# Patient Record
Sex: Male | Born: 1975 | Race: White | Hispanic: No | Smoking: Current every day smoker
Health system: Southern US, Community
[De-identification: ages and names within clinical notes are randomized; demographics above are authoritative.]

## PROBLEM LIST (undated history)

## (undated) ENCOUNTER — Emergency Department (HOSPITAL_COMMUNITY): Payer: Self-pay

## (undated) DIAGNOSIS — K4090 Unilateral inguinal hernia, without obstruction or gangrene, not specified as recurrent: Secondary | ICD-10-CM

## (undated) DIAGNOSIS — I10 Essential (primary) hypertension: Secondary | ICD-10-CM

## (undated) HISTORY — PX: WISDOM TOOTH EXTRACTION: SHX21

---

## 2006-01-24 ENCOUNTER — Emergency Department (HOSPITAL_COMMUNITY): Admission: EM | Admit: 2006-01-24 | Discharge: 2006-01-24 | Payer: Self-pay | Admitting: Emergency Medicine

## 2006-08-24 ENCOUNTER — Emergency Department (HOSPITAL_COMMUNITY): Admission: EM | Admit: 2006-08-24 | Discharge: 2006-08-25 | Payer: Self-pay | Admitting: Emergency Medicine

## 2011-12-18 ENCOUNTER — Encounter (HOSPITAL_COMMUNITY): Payer: Self-pay | Admitting: *Deleted

## 2011-12-18 ENCOUNTER — Emergency Department (HOSPITAL_COMMUNITY)
Admission: EM | Admit: 2011-12-18 | Discharge: 2011-12-18 | Disposition: A | Discharge status: Home / Self Care | Payer: Self-pay | Attending: Emergency Medicine | Admitting: Emergency Medicine

## 2011-12-18 DIAGNOSIS — L03039 Cellulitis of unspecified toe: Secondary | ICD-10-CM | POA: Insufficient documentation

## 2011-12-18 DIAGNOSIS — L03031 Cellulitis of right toe: Secondary | ICD-10-CM

## 2011-12-18 LAB — CBC
MCV: 90.3 fL (ref 78.0–100.0)
Platelets: 205 10*3/uL (ref 150–400)
RBC: 5.06 MIL/uL (ref 4.22–5.81)
WBC: 10.9 10*3/uL — ABNORMAL HIGH (ref 4.0–10.5)

## 2011-12-18 LAB — BASIC METABOLIC PANEL
CO2: 30 mEq/L (ref 19–32)
Chloride: 99 mEq/L (ref 96–112)
Sodium: 138 mEq/L (ref 135–145)

## 2011-12-18 MED ORDER — DIPHENHYDRAMINE HCL 25 MG PO CAPS
ORAL_CAPSULE | ORAL | Status: AC
  Administered 2011-12-18: 25 mg
  Filled 2011-12-18: qty 1

## 2011-12-18 MED ORDER — VANCOMYCIN HCL IN DEXTROSE 1-5 GM/200ML-% IV SOLN
1000.0000 mg | Freq: Once | INTRAVENOUS | Status: AC
  Administered 2011-12-18: 1000 mg via INTRAVENOUS
  Filled 2011-12-18: qty 200

## 2011-12-18 MED ORDER — DOXYCYCLINE HYCLATE 100 MG PO CAPS: 100.0000 mg | ORAL_CAPSULE | Freq: Two times a day (BID) | ORAL | Status: DC

## 2011-12-18 MED ORDER — FENTANYL CITRATE 0.05 MG/ML IJ SOLN: 100.0000 ug | Freq: Once | INTRAMUSCULAR | Status: DC

## 2011-12-18 MED ORDER — IBUPROFEN 200 MG PO TABS: 800.0000 mg | ORAL_TABLET | Freq: Four times a day (QID) | ORAL | Status: DC | PRN

## 2011-12-18 MED ORDER — DOXYCYCLINE HYCLATE 100 MG PO CAPS: 100.0000 mg | ORAL_CAPSULE | Freq: Two times a day (BID) | ORAL | Status: AC

## 2011-12-18 MED ORDER — DOXYCYCLINE HYCLATE 100 MG PO TABS
100.0000 mg | ORAL_TABLET | Freq: Once | ORAL | Status: AC
  Administered 2011-12-18: 100 mg via ORAL
  Filled 2011-12-18: qty 1

## 2011-12-18 NOTE — ED Provider Notes (Addendum)
History     CSN: [id number]  Arrival date & time 12/18/11  0401   First MD Initiated Contact with Patient 12/18/11 [phone]      Chief Complaint  Patient presents with  . Toe infection     (Consider location/radiation/quality/duration/timing/severity/associated sxs/prior treatment) HPI So 36 year old white male who [place]. He slipped on some heard.jammed under the nail of the right fifth toe. That toe subsequently become swollen, erythematous and painful. He states the pain has been severe at its worst. It is painful to walk. There's been purulent drainage from the medial side of the nail which is where the pain is the worst. There's also is red streaking up his right foot and lower leg.  History reviewed. No pertinent past medical history.  History reviewed. No pertinent past surgical history.  No family history on file.  History  Substance Use Topics  . Smoking status: Current Everyday Smoker  . Smokeless tobacco: Not on file  . Alcohol Use: Yes     occasionally      Review of Systems  All other systems reviewed and are negative.    Allergies  Morphine and related  Home Medications   Current Outpatient Rx  Name Route Sig Dispense Refill  . IBUPROFEN 200 MG PO TABS Oral Take 800 mg by mouth every 6 (six) hours as needed. For pain      BP 126/80  Pulse 87  Temp 97.8 F (36.6 C) (Oral)  Resp 18  Wt 138 lb (62.596 kg)  SpO2 99%  Physical Exam General: Well-developed, well-nourished male in no acute distress; appearance consistent with age of record HENT: normocephalic, atraumatic Eyes: pupils equal round and reactive to light; extraocular muscles intact Neck: supple Heart: regular rate and rhythm Lungs: clear to auscultation bilaterally Abdomen: soft, nondistended Extremities: No deformity; full range of motion; pulses normal; paronychia of the medial side of the right fifth toenail with swelling and erythema of the  right fifth toe and lymphangitis from the right fifth toe the foot right foot and lower leg; pad of right fifth toe soft without evidence of felon Lymphatic: No inguinal lymphadenopathy Neurologic: Awake, alert and oriented; motor function intact in all extremities and symmetric; no facial droop Skin: Warm and dry Psychiatric: Normal mood and affect    ED Course  Procedures (including critical care time)  INCISION AND DRAINAGE Performed by: Paula Libra L Consent: Verbal consent obtained. Risks and benefits: risks, benefits and alternatives were discussed Type: Paronychia   Body area: Medial aspect of right fifth toenail  Anesthesia: patient refused  Complexity: Simple  Drainage: purulent  Drainage amount: Several drops; swab obtained for C&S   Packing material: None   Patient tolerance: Patient tolerated the procedure well with no immediate complications.      MDM  7:18 AM Vancomycin 1 g IV ordered. We'll discharge home on doxycycline and have him return in 24 hours for recheck return sooner for worsening symptoms.           Hanley Seamen, MD 12/18/11 2841  Hanley Seamen, MD 12/18/11 [phone]

## 2011-12-18 NOTE — ED Notes (Signed)
Pt [place], walking over some stones and his foot slipped, pt states "he thinks he got a bunch of dirt under his toe nail" and pt is now experiencing redness and drainage from rt fifth toe.

## 2011-12-18 NOTE — ED Notes (Addendum)
Pt presents to ED with c/o pain and tenderness to right 5th toe, 5th toe is red and swollen.  Pt states that his right 5th toe hit a rock last Monday, states that area is progressively "getting worse". '. '

## 2011-12-19 NOTE — ED Notes (Signed)
Per Earley Favor NP, Rx changed to Clindamycin 150 mg - 1 PO TID x 10 days.  rx was changed due to it being too expensive. Called into [facility] [phone]

## 2011-12-20 LAB — CULTURE, ROUTINE-ABSCESS

## 2012-06-30 ENCOUNTER — Emergency Department (HOSPITAL_COMMUNITY)
Admission: EM | Admit: 2012-06-30 | Discharge: 2012-06-30 | Disposition: A | Discharge status: Home / Self Care | Payer: Self-pay | Attending: Emergency Medicine | Admitting: Emergency Medicine

## 2012-06-30 ENCOUNTER — Encounter (HOSPITAL_COMMUNITY): Payer: Self-pay | Admitting: *Deleted

## 2012-06-30 DIAGNOSIS — L509 Urticaria, unspecified: Secondary | ICD-10-CM | POA: Insufficient documentation

## 2012-06-30 DIAGNOSIS — F17200 Nicotine dependence, unspecified, uncomplicated: Secondary | ICD-10-CM | POA: Insufficient documentation

## 2012-06-30 MED ORDER — PREDNISONE 20 MG PO TABS: 40.0000 mg | ORAL_TABLET | Freq: Every day | ORAL | Status: DC

## 2012-06-30 MED ORDER — HYDROXYZINE HCL 25 MG PO TABS: 25.0000 mg | ORAL_TABLET | Freq: Four times a day (QID) | ORAL | Status: DC

## 2012-06-30 NOTE — ED Provider Notes (Signed)
History     CSN: [id number]  Arrival date & time 06/30/12  0052   First MD Initiated Contact with Patient 06/30/12 [phone]      Chief Complaint  Patient presents with  . Rash    (Consider location/radiation/quality/duration/timing/severity/associated sxs/prior treatment) HPI  Carlos Avila is a 36 y.o. male complaining of complaining of pruritic rash on torso and proximal extremities for 10 days. Patient denies any new environmental exposures. He denies any similar rashes with household members. He's been using over-the-counter hydrocortisone ointment and Benadryl with little relief. Patient has had multiple prior similar exacerbations. Patient denies any shortness of breath, GI upset.  History reviewed. No pertinent past medical history.  History reviewed. No pertinent past surgical history.  No family history on file.  History  Substance Use Topics  . Smoking status: Current Every Day Smoker  . Smokeless tobacco: Not on file  . Alcohol Use: Yes     Comment: occasionally      Review of Systems  Constitutional: Negative for fever.  Respiratory: Negative for shortness of breath.   Cardiovascular: Negative for chest pain.  Gastrointestinal: Negative for nausea, vomiting, abdominal pain and diarrhea.  Skin: Positive for rash.  All other systems reviewed and are negative.    Allergies  Morphine and related  Home Medications   Current Outpatient Rx  Name  Route  Sig  Dispense  Refill  . HYDROXYZINE HCL 25 MG PO TABS   Oral   Take 1 tablet (25 mg total) by mouth every 6 (six) hours.   12 tablet   0   . IBUPROFEN 200 MG PO TABS   Oral   Take 4 tablets (800 mg total) by mouth every 6 (six) hours as needed. For pain   15 tablet   0   . PREDNISONE 20 MG PO TABS   Oral   Take 2 tablets (40 mg total) by mouth daily.   10 tablet   0     BP 152/90  Pulse 89  Temp 98.1 F (36.7 C) (Oral)  Resp 20  Ht 5\' 5"  (1.651 m)  Wt 140 lb (63.504 kg)  BMI 23.30  kg/m2  SpO2 98%  Physical Exam  Nursing note and vitals reviewed. Constitutional: He is oriented to person, place, and time. He appears well-developed and well-nourished. No distress.  HENT:  Head: Normocephalic.  Eyes: Conjunctivae normal and EOM are normal.  Cardiovascular: Normal rate.   Pulmonary/Chest: Effort normal. No stridor.  Musculoskeletal: Normal range of motion.  Neurological: He is alert and oriented to person, place, and time.  Skin:       Mildly excoriated hives to torso, buttocks, proximal lower and upper extremities. No tenderness, warmth, discharge.   Psychiatric: He has a normal mood and affect.    ED Course  Procedures (including critical care time)  Labs Reviewed - No data to display No results found.   1. Hives       MDM  Likely allergic reaction, I will give the patient Atarax and a burst of prednisone.   Pt verbalized understanding and agrees with care plan. Outpatient follow-up and return precautions given.     New Prescriptions   HYDROXYZINE (ATARAX/VISTARIL) 25 MG TABLET    Take 1 tablet (25 mg total) by mouth every 6 (six) hours.   PREDNISONE (DELTASONE) 20 MG TABLET    Take 2 tablets (40 mg total) by mouth daily.          Wynetta Emery, PA-C 06/30/12 2025

## 2012-06-30 NOTE — ED Provider Notes (Signed)
Medical screening examination/treatment/procedure(s) were performed by non-physician practitioner and as supervising physician I was immediately available for consultation/collaboration.  Olivia Mackie, MD 06/30/12 2147

## 2012-06-30 NOTE — ED Notes (Signed)
Pt c/o hives on and off x 10 days; getting worse over buttocks and torso; itching; no respiratory involvement

## 2012-11-29 ENCOUNTER — Encounter (HOSPITAL_COMMUNITY): Payer: Self-pay | Admitting: Emergency Medicine

## 2012-11-29 ENCOUNTER — Emergency Department (HOSPITAL_COMMUNITY)
Admission: EM | Admit: 2012-11-29 | Discharge: 2012-11-29 | Disposition: A | Discharge status: Home / Self Care | Payer: Self-pay | Attending: Emergency Medicine | Admitting: Emergency Medicine

## 2012-11-29 ENCOUNTER — Emergency Department (HOSPITAL_COMMUNITY): Payer: Self-pay

## 2012-11-29 DIAGNOSIS — Y9389 Activity, other specified: Secondary | ICD-10-CM | POA: Insufficient documentation

## 2012-11-29 DIAGNOSIS — W260XXA Contact with knife, initial encounter: Secondary | ICD-10-CM | POA: Insufficient documentation

## 2012-11-29 DIAGNOSIS — S61409A Unspecified open wound of unspecified hand, initial encounter: Secondary | ICD-10-CM | POA: Insufficient documentation

## 2012-11-29 DIAGNOSIS — Y9289 Other specified places as the place of occurrence of the external cause: Secondary | ICD-10-CM | POA: Insufficient documentation

## 2012-11-29 DIAGNOSIS — S61412A Laceration without foreign body of left hand, initial encounter: Secondary | ICD-10-CM

## 2012-11-29 DIAGNOSIS — Z23 Encounter for immunization: Secondary | ICD-10-CM | POA: Insufficient documentation

## 2012-11-29 DIAGNOSIS — F17200 Nicotine dependence, unspecified, uncomplicated: Secondary | ICD-10-CM | POA: Insufficient documentation

## 2012-11-29 MED ORDER — LIDOCAINE-EPINEPHRINE 2 %-1:100000 IJ SOLN
20.0000 mL | Freq: Once | INTRAMUSCULAR | Status: AC
  Administered 2012-11-29: 20 mL via INTRADERMAL
  Filled 2012-11-29: qty 1

## 2012-11-29 MED ORDER — OXYCODONE-ACETAMINOPHEN 5-325 MG PO TABS
2.0000 | ORAL_TABLET | Freq: Once | ORAL | Status: AC
  Administered 2012-11-29: 2 via ORAL
  Filled 2012-11-29: qty 2

## 2012-11-29 MED ORDER — TETANUS-DIPHTH-ACELL PERTUSSIS 5-2.5-18.5 LF-MCG/0.5 IM SUSP
0.5000 mL | Freq: Once | INTRAMUSCULAR | Status: AC
  Administered 2012-11-29: 0.5 mL via INTRAMUSCULAR
  Filled 2012-11-29: qty 0.5

## 2012-11-29 MED ORDER — OXYCODONE-ACETAMINOPHEN 5-325 MG PO TABS: 1.0000 | ORAL_TABLET | ORAL | Status: DC | PRN

## 2012-11-29 MED ORDER — DIPHENHYDRAMINE HCL 25 MG PO CAPS
25.0000 mg | ORAL_CAPSULE | Freq: Once | ORAL | Status: AC
Start: 2012-11-29 — End: 2012-11-29
  Administered 2012-11-29: 25 mg via ORAL
  Filled 2012-11-29: qty 1

## 2012-11-29 NOTE — ED Notes (Signed)
1.5 cm deep laceration at anterior base of l/thumb. Bleeding controlled with pressure

## 2012-11-29 NOTE — ED Provider Notes (Signed)
History  This chart was scribed for Carlos Razor, MD by Ardelia Mems, ED Scribe. This patient was seen in room WTR8/WTR8 and the patient's care was started at 3:08 PM.   CSN: [id number]  Arrival date & time 11/29/12  1435     Chief Complaint  Patient presents with  . Laceration    cut base of thumb  on l/hand with fishing knife. Wound open and bleeding    The history is provided by the patient. No language interpreter was used.    HPI Comments: Carlos Avila is a 37 y.o. male who presents to the Emergency Department complaining of a 1.5 cm deep laceration to the anterior base of his left thumb onset about 1 hour ago. Pt states that he accidentally cut himself with a fishing knife while trying to cut a zip tie off of a fishing rod. Pt states that the knife did not break off in his hand. There is associated constant, moderate pain of the left thumb. Pt states that the area immediately surrounding the wound is slightly numb, but no numbness to the fingers. Bleeding of the wound is able to be controlled with direct pressure. Pt states that he is able to move his hand and wrist. Pt states that he has no significant medical problems. Pt denies LOC and fever. Pt is a current every day smoker and uses alcohol occasionally.    History reviewed. No pertinent past medical history.  History reviewed. No pertinent past surgical history.  History reviewed. No pertinent family history.  History  Substance Use Topics  . Smoking status: Current Every Day Smoker    Types: Cigarettes  . Smokeless tobacco: Not on file  . Alcohol Use: Yes     Comment: occasionally      Review of Systems  Constitutional: Negative for fever and chills.  Gastrointestinal: Negative for nausea, vomiting and diarrhea.  Skin: Positive for wound. Negative for rash.  Neurological: Negative for syncope, light-headedness and headaches.  All other systems reviewed and are negative.    Allergies  Morphine and  related  Home Medications   Current Outpatient Rx  Name  Route  Sig  Dispense  Refill  . ibuprofen (ADVIL,MOTRIN) 200 MG tablet   Oral   Take 4 tablets (800 mg total) by mouth every 6 (six) hours as needed. For pain   15 tablet   0   . oxyCODONE-acetaminophen (PERCOCET/ROXICET) 5-325 MG per tablet   Oral   Take 1 tablet by mouth every 4 (four) hours as needed for pain.   6 tablet   0     Triage Vitals: BP 130/92  Pulse 93  Temp(Src) 98.3 F (36.8 C) (Oral)  Wt 145 lb (65.772 kg)  BMI 24.13 kg/m2  SpO2 98%  Physical Exam  Nursing note and vitals reviewed. Constitutional: He is oriented to person, place, and time. He appears well-developed and well-nourished.  HENT:  Head: Normocephalic and atraumatic.  Eyes: EOM are normal. Pupils are equal, round, and reactive to light.  Neck: Normal range of motion. Neck supple. No tracheal deviation present.  Cardiovascular: Normal rate, regular rhythm, normal heart sounds and intact distal pulses.   No murmur heard. Pulses:      Radial pulses are 2+ on the right side, and 2+ on the left side.  Capillary refill < 3  Pulmonary/Chest: Effort normal and breath sounds normal. No respiratory distress.  Abdominal: Soft. Bowel sounds are normal. There is no tenderness.  Musculoskeletal: Normal range of motion.  He exhibits no tenderness.  Full ROM of the left hand including fingers Intact flexor and extensor tendons  Neurological: He is alert and oriented to person, place, and time. GCS eye subscore is 4. GCS verbal subscore is 5. GCS motor subscore is 6.  5/5 strength.   Skin: Skin is warm. No rash noted.  1.5 cm penetrating wound to the thenar eminence of left hand.  Capillary refill less than 3 seconds. Sensation in the distribution of the medial ulnar and radial nerves.  Sensation and strength of the hands are normal.    Psychiatric: He has a normal mood and affect.    ED Course  LACERATION REPAIR Date/Time: 11/29/2012 3:50  PM Performed by: Dierdre Forth Authorized by: Dierdre Forth Consent: Verbal consent obtained. Risks and benefits: risks, benefits and alternatives were discussed Consent given by: patient Patient understanding: patient states understanding of the procedure being performed Patient consent: the patient's understanding of the procedure matches consent given Procedure consent: procedure consent matches procedure scheduled Relevant documents: relevant documents present and verified Site marked: the operative site was marked Imaging studies: imaging studies available Required items: required blood products, implants, devices, and special equipment available Patient identity confirmed: verbally with patient and arm band Time out: Immediately prior to procedure a "time out" was called to verify the correct patient, procedure, equipment, support staff and site/side marked as required. Body area: upper extremity Location details: left hand Laceration length: 1.5 cm Foreign bodies: no foreign bodies Tendon involvement: none Nerve involvement: none Vascular damage: no Anesthesia: local infiltration Local anesthetic: lidocaine 1% without epinephrine Anesthetic total: 3 ml Patient sedated: no Irrigation solution: saline and tap water Irrigation method: syringe and tap Amount of cleaning: extensive Debridement: none Degree of undermining: none Skin closure: 5-0 Prolene Number of sutures: 2 Technique: simple Approximation: close Approximation difficulty: simple Dressing: 4x4 sterile gauze Patient tolerance: Patient tolerated the procedure well with no immediate complications.   (including critical care time)  DIAGNOSTIC STUDIES: Oxygen Saturation is 98% on RA, normal by my interpretation.    COORDINATION OF CARE: 3:26 PM- Pt advised of plan for treatment and pt agrees.  Medications  TDaP (BOOSTRIX) injection 0.5 mL (0.5 mLs Intramuscular Given 11/29/12 1549)   lidocaine-EPINEPHrine (XYLOCAINE W/EPI) 2 %-1:100000 (with pres) injection 20 mL (20 mLs Intradermal Given 11/29/12 1551)      Labs Reviewed - No data to display Dg Hand Complete Left  11/29/2012   *RADIOLOGY REPORT*  Clinical Data: Laceration near first metacarpal phalangeal joint on the anterior surface.  LEFT HAND - COMPLETE 3+ VIEW  Comparison: None.  Findings: No acute fracture or dislocation.  No radio-opaque foreign body.  IMPRESSION: No acute osseous abnormality.   Original Report Authenticated By: Jeronimo Greaves, M.D.     1. Laceration of hand, left, initial encounter       MDM  Darlyne Russian presents with laceration.  Tdap booster given. X-ray without foreign body or acute fracture or dislocation.  I personally reviewed the imaging tests through PACS system.  I reviewed available ER/hospitalization records through the EMR.  Pressure irrigation performed. Laceration occurred < 8 hours prior to repair which was well tolerated. Pt has no co morbidities to effect normal wound healing. Discussed suture home care w pt and answered questions. Pt to f-u for wound check and suture removal in 7 days. Pt is hemodynamically stable w no complaints prior to dc.     I personally performed the services described in this documentation, which was scribed in  my presence. The recorded information has been reviewed and is accurate.   Dahlia Client Trip Cavanagh, PA-C 11/29/12 1555

## 2012-11-30 NOTE — ED Provider Notes (Signed)
Medical screening examination/treatment/procedure(s) were performed by non-physician practitioner and as supervising physician I was immediately available for consultation/collaboration.  Khylon Davies, MD 11/30/12 1548 

## 2012-12-06 ENCOUNTER — Emergency Department (HOSPITAL_COMMUNITY)
Admission: EM | Admit: 2012-12-06 | Discharge: 2012-12-06 | Disposition: A | Discharge status: Home / Self Care | Payer: Self-pay | Attending: Emergency Medicine | Admitting: Emergency Medicine

## 2012-12-06 ENCOUNTER — Encounter (HOSPITAL_COMMUNITY): Payer: Self-pay | Admitting: Emergency Medicine

## 2012-12-06 DIAGNOSIS — F17200 Nicotine dependence, unspecified, uncomplicated: Secondary | ICD-10-CM | POA: Insufficient documentation

## 2012-12-06 DIAGNOSIS — M79609 Pain in unspecified limb: Secondary | ICD-10-CM | POA: Insufficient documentation

## 2012-12-06 DIAGNOSIS — Z4802 Encounter for removal of sutures: Secondary | ICD-10-CM | POA: Insufficient documentation

## 2012-12-06 MED ORDER — OXYCODONE-ACETAMINOPHEN 5-325 MG PO TABS: 1.0000 | ORAL_TABLET | ORAL | Status: DC | PRN

## 2012-12-06 MED ORDER — SULFAMETHOXAZOLE-TRIMETHOPRIM 800-160 MG PO TABS: 1.0000 | ORAL_TABLET | Freq: Two times a day (BID) | ORAL | Status: DC

## 2012-12-06 NOTE — ED Provider Notes (Signed)
History    This chart was scribed for a non-physician practitioner working with Carlos Munch, MD by Carlos Avila, ED scribe. This patient was seen in room WTR5/WTR5 and the patient's care was started at 9:17 PM.  CSN: [id number]  Arrival date & time 12/06/12  2025  Chief Complaint  Patient presents with  . Suture / Staple Removal    The history is provided by the patient and medical records. No language interpreter was used.   HPI Comments: Carlos Avila is a 37 y.o. male who presents to the Emergency Department needing 2 sutures removed from his left palm.  These stitches were places on November 29, 2012  after he cut himself on the palm of his left hand with a knife while fishing.  He claims mild to moderate pain in the area of the stitches.  He claims the area is slightly red and raised since the stitches were placed last week.  Pt denies headache, diaphoresis, fever, chills, nausea, vomiting, diarrhea, weakness, cough, SOB and any other pain.   History reviewed. No pertinent past medical history.  History reviewed. No pertinent past surgical history.  No family history on file.  History  Substance Use Topics  . Smoking status: Current Every Day Smoker    Types: Cigarettes  . Smokeless tobacco: Never Used  . Alcohol Use: Yes     Comment: occasionally      Review of Systems  Constitutional: Negative for fever, diaphoresis, appetite change, fatigue and unexpected weight change.  HENT: Negative for mouth sores and neck stiffness.   Eyes: Negative for visual disturbance.  Respiratory: Negative for cough, chest tightness, shortness of breath and wheezing.   Cardiovascular: Negative for chest pain.  Gastrointestinal: Negative for nausea, vomiting, abdominal pain, diarrhea and constipation.  Endocrine: Negative for polydipsia, polyphagia and polyuria.  Genitourinary: Negative for dysuria, urgency, frequency and hematuria.  Musculoskeletal: Negative for back pain.  Skin: Positive  for wound (left palm). Negative for rash.  Allergic/Immunologic: Negative for immunocompromised state.  Neurological: Negative for syncope, light-headedness and headaches.  Hematological: Does not bruise/bleed easily.  Psychiatric/Behavioral: Negative for sleep disturbance. The patient is not nervous/anxious.     Allergies  Morphine and related  Home Medications   Current Outpatient Rx  Name  Route  Sig  Dispense  Refill  . ibuprofen (ADVIL,MOTRIN) 200 MG tablet   Oral   Take 4 tablets (800 mg total) by mouth every 6 (six) hours as needed. For pain   15 tablet   0   . oxyCODONE-acetaminophen (PERCOCET/ROXICET) 5-325 MG per tablet   Oral   Take 1 tablet by mouth every 4 (four) hours as needed for pain.   3 tablet   0   . sulfamethoxazole-trimethoprim (SEPTRA DS) 800-160 MG per tablet   Oral   Take 1 tablet by mouth every 12 (twelve) hours.   20 tablet   0     BP 146/95  Pulse 88  Temp(Src) 98 F (36.7 C) (Oral)  Resp 20  SpO2 98%  Physical Exam  Nursing note and vitals reviewed. Constitutional: He appears well-developed and well-nourished. No distress.  HENT:  Head: Normocephalic and atraumatic.  Mouth/Throat: No oropharyngeal exudate.  Eyes: Conjunctivae are normal. No scleral icterus.  Neck: Normal range of motion.  Cardiovascular: Normal rate, regular rhythm, normal heart sounds and intact distal pulses.   No murmur heard. Pulmonary/Chest: Effort normal and breath sounds normal. No respiratory distress. He has no wheezes.  Musculoskeletal: Normal range of motion. He  exhibits no edema.  Neurological: He is alert.  Speech is clear and goal oriented Moves extremities without ataxia  Skin: Skin is warm and dry. He is not diaphoretic. There is erythema.  1.5 cm laceration to the thenar eminence of the left hand.  Mild erythema and induration without fluctuance or evidence of abscess.    Psychiatric: He has a normal mood and affect.    ED Course  Procedures  (including critical care time)  COORDINATION OF CARE: 9:16 PM - Stitches Removal  9:18 PM - Discussed ED treatment with pt at bedside including antibiotic and pt agrees.   SUTURE REMOVAL Performed by: Carlos Avila  Consent: Verbal consent obtained. Patient identity confirmed: provided demographic data Time out: Immediately prior to procedure a "time out" was called to verify the correct patient, procedure, equipment, support staff and site/side marked as required.  Location details: L hand  Wound Appearance: clean, erythematous, mildly indurated  Sutures/Staples Removed: 2  Facility: sutures placed in this facility Patient tolerance: Patient tolerated the procedure well with no immediate complications.    Labs Reviewed - No data to display No results found.   1. Visit for suture removal       MDM  Carlos Avila presents for suture removal.  Pt to ER for staple/suture removal and wound check as above. Procedure tolerated well. Vitals normal, mild signs of infection; will begin on bactrim. Scar minimization & return precautions given at dc. I have also discussed reasons to return immediately to the ER.  Patient expresses understanding and agrees with plan.  I personally performed the services described in this documentation, which was scribed in my presence. The recorded information has been reviewed and is accurate.    Dahlia Client Carlos Engdahl, PA-C 12/06/12 2141

## 2012-12-06 NOTE — ED Notes (Signed)
JYN:[id number]<AO> Expected date:<BR> Expected time:<BR> Means of arrival:<BR> Comments:<BR> Hold...needs cleaning. Scabies

## 2012-12-06 NOTE — ED Notes (Signed)
Pt reports a laceration repair 7 days ago and presents today for suture removal. Wound is approximated with redness around the site.

## 2012-12-07 NOTE — ED Provider Notes (Signed)
  Medical screening examination/treatment/procedure(s) were performed by non-physician practitioner and as supervising physician I was immediately available for consultation/collaboration.    Luan Urbani, MD 12/07/12 0013 

## 2013-04-08 ENCOUNTER — Encounter (HOSPITAL_COMMUNITY): Payer: Self-pay | Admitting: Emergency Medicine

## 2013-04-08 ENCOUNTER — Emergency Department (HOSPITAL_COMMUNITY): Payer: Self-pay

## 2013-04-08 ENCOUNTER — Emergency Department (HOSPITAL_COMMUNITY)
Admission: EM | Admit: 2013-04-08 | Discharge: 2013-04-08 | Disposition: A | Discharge status: Home / Self Care | Payer: Self-pay | Attending: Emergency Medicine | Admitting: Emergency Medicine

## 2013-04-08 DIAGNOSIS — R079 Chest pain, unspecified: Secondary | ICD-10-CM

## 2013-04-08 DIAGNOSIS — R0989 Other specified symptoms and signs involving the circulatory and respiratory systems: Secondary | ICD-10-CM | POA: Insufficient documentation

## 2013-04-08 DIAGNOSIS — R0609 Other forms of dyspnea: Secondary | ICD-10-CM | POA: Insufficient documentation

## 2013-04-08 DIAGNOSIS — R0789 Other chest pain: Secondary | ICD-10-CM | POA: Insufficient documentation

## 2013-04-08 DIAGNOSIS — F17200 Nicotine dependence, unspecified, uncomplicated: Secondary | ICD-10-CM | POA: Insufficient documentation

## 2013-04-08 LAB — CBC WITH DIFFERENTIAL/PLATELET
Basophils Absolute: 0.1 10*3/uL (ref 0.0–0.1)
Basophils Relative: 1 % (ref 0–1)
Eosinophils Absolute: 0.5 10*3/uL (ref 0.0–0.7)
Eosinophils Relative: 6 % — ABNORMAL HIGH (ref 0–5)
HCT: 45.3 % (ref 39.0–52.0)
Hemoglobin: 15.9 g/dL (ref 13.0–17.0)
Lymphocytes Relative: 25 % (ref 12–46)
Lymphs Abs: 2.2 10*3/uL (ref 0.7–4.0)
MCH: 30.6 pg (ref 26.0–34.0)
MCHC: 35.1 g/dL (ref 30.0–36.0)
MCV: 87.3 fL (ref 78.0–100.0)
Monocytes Absolute: 1.1 10*3/uL — ABNORMAL HIGH (ref 0.1–1.0)
Monocytes Relative: 13 % — ABNORMAL HIGH (ref 3–12)
Neutro Abs: 4.7 10*3/uL (ref 1.7–7.7)
Neutrophils Relative %: 55 % (ref 43–77)
Platelets: 219 10*3/uL (ref 150–400)
RBC: 5.19 MIL/uL (ref 4.22–5.81)
RDW: 12.7 % (ref 11.5–15.5)
WBC: 8.5 10*3/uL (ref 4.0–10.5)

## 2013-04-08 LAB — TROPONIN I: Troponin I: <0.3 ng/mL (ref <0.30)

## 2013-04-08 LAB — BASIC METABOLIC PANEL
BUN: 11 mg/dL (ref 6–23)
CO2: 29 mEq/L (ref 19–32)
Calcium: 10.8 mg/dL — ABNORMAL HIGH (ref 8.4–10.5)
Chloride: 100 mEq/L (ref 96–112)
Creatinine, Ser: 0.81 mg/dL (ref 0.50–1.35)
GFR calc Af Amer: >90 mL/min (ref >90)
GFR calc non Af Amer: >90 mL/min (ref >90)
Glucose, Bld: 91 mg/dL (ref 70–99)
Potassium: 3.5 mEq/L (ref 3.5–5.1)
Sodium: 139 mEq/L (ref 135–145)

## 2013-04-08 NOTE — ED Notes (Signed)
Pt refused IV  

## 2013-04-08 NOTE — ED Notes (Signed)
Pt states he has pain in his chest that started yesterday around noon  Pt states the pain feels like it is outside the lung area and states it feels like something is pushing against him  Pt states the pain is intermittent and varies in intensity  Pt denies any cold symptoms or any other problems at this time

## 2013-04-13 NOTE — ED Provider Notes (Signed)
CSN: [id number]     Arrival date & time 04/08/13  1940 History   First MD Initiated Contact with Patient 04/08/13 2000     Chief Complaint  Patient presents with  . Chest Pain   (Consider location/radiation/quality/duration/timing/severity/associated sxs/prior Treatment) HPI  Interstitial male with chest pain. Onset yesterday around noon. Pain is intermittent. He feels with deep breathing and coughing. He denies any shortness of breath. No fevers or chills. No change with exertion. No unusual leg pain or swelling. Denies recent procedures, prolonged immobilization or prior history of DVT. Has tried taking tylenol with minimal relief. Patient is a smoker. Denies drug use.  History reviewed. No pertinent past medical history. History reviewed. No pertinent past surgical history. Family History  Problem Relation Age of Onset  . Cancer Other    History  Substance Use Topics  . Smoking status: Current Every Day Smoker    Types: Cigarettes  . Smokeless tobacco: Never Used  . Alcohol Use: Yes     Comment: occasionally    Review of Systems  All systems reviewed and negative, other than as noted in HPI.   Allergies  Morphine and related  Home Medications   Current Outpatient Rx  Name  Route  Sig  Dispense  Refill  . acetaminophen (TYLENOL) 500 MG tablet   Oral   Take 1,000 mg by mouth every 6 (six) hours as needed for pain.         . diphenhydrAMINE (BENADRYL) 25 mg capsule   Oral   Take 25 mg by mouth every 6 (six) hours as needed for itching.         Marland Kitchen ibuprofen (ADVIL,MOTRIN) 200 MG tablet   Oral   Take 4 tablets (800 mg total) by mouth every 6 (six) hours as needed. For pain   15 tablet   0    BP 154/107  Pulse 86  Temp(Src) 98.2 F (36.8 C) (Oral)  Resp 15  Ht 5\' 5"  (1.651 m)  Wt 150 lb (68.04 kg)  BMI 24.96 kg/m2  SpO2 99% Physical Exam  Nursing note and vitals reviewed. Constitutional: He appears well-developed and well-nourished. No distress.   HENT:  Head: Normocephalic and atraumatic.  Eyes: Conjunctivae are normal. Right eye exhibits no discharge. Left eye exhibits no discharge.  Neck: Neck supple.  Cardiovascular: Normal rate, regular rhythm and normal heart sounds.  Exam reveals no gallop and no friction rub.   No murmur heard. Pulmonary/Chest: Effort normal and breath sounds normal. No respiratory distress. He exhibits no tenderness.  Abdominal: Soft. He exhibits no distension. There is no tenderness.  Musculoskeletal: He exhibits no edema and no tenderness.  Lower extremities symmetric as compared to each other. No calf tenderness. Negative Homan's. No palpable cords.   Neurological: He is alert.  Skin: Skin is warm and dry.  Psychiatric: He has a normal mood and affect. His behavior is normal. Thought content normal.    ED Course  Procedures (including critical care time) Labs Review Labs Reviewed  CBC WITH DIFFERENTIAL - Abnormal; Notable for the following:    Monocytes Relative 13 (*)    Monocytes Absolute 1.1 (*)    Eosinophils Relative 6 (*)    All other components within normal limits  BASIC METABOLIC PANEL - Abnormal; Notable for the following:    Calcium 10.8 (*)    All other components within normal limits  TROPONIN I   Imaging Review No results found.  EKG Interpretation   None  Dg Chest 2 View  04/08/2013   CLINICAL DATA:  Chest pain.  EXAM: CHEST  2 VIEW  COMPARISON:  None.  FINDINGS: Normal sized heart. Clear lungs. Mild diffuse peribronchial thickening and accentuation of the interstitial markings. Mild thoracic spine degenerative changes.  IMPRESSION: Mild chronic bronchitic changes and mild chronic interstitial lung disease.   Electronically Signed   By: Gordan Payment M.D.   On: 04/08/2013 20:49   EKG:  Rhythm: normal sinus Vent. rate 77 BPM PR interval 118 ms QRS duration 82 ms QT/QTc 366/415 ms ST segments: normal   MDM   1. Chest pain    87 six-year-old male with chest  pain. Atypical for ACS. EKG without ischemic changes. Patient with no respiratory complaints. No increased work of breathing. Oxygen saturations are good on room air. Chest x-ray is clear. Doubt pulmonary embolism, infectious or dissection. Plan symptomatic treatment for possible pleurisy. Patient encouraged to quit smoking. Outpatient followup for repeat check of his blood pressure.    Raeford Razor, MD 04/13/13 [phone]

## 2013-11-28 ENCOUNTER — Encounter (HOSPITAL_COMMUNITY): Payer: Self-pay | Admitting: Emergency Medicine

## 2013-11-28 ENCOUNTER — Emergency Department (HOSPITAL_COMMUNITY)
Admission: EM | Admit: 2013-11-28 | Discharge: 2013-11-28 | Disposition: A | Discharge status: Home / Self Care | Payer: Self-pay | Attending: Emergency Medicine | Admitting: Emergency Medicine

## 2013-11-28 DIAGNOSIS — H9209 Otalgia, unspecified ear: Secondary | ICD-10-CM | POA: Insufficient documentation

## 2013-11-28 DIAGNOSIS — Z792 Long term (current) use of antibiotics: Secondary | ICD-10-CM | POA: Insufficient documentation

## 2013-11-28 DIAGNOSIS — J020 Streptococcal pharyngitis: Secondary | ICD-10-CM | POA: Insufficient documentation

## 2013-11-28 DIAGNOSIS — F17200 Nicotine dependence, unspecified, uncomplicated: Secondary | ICD-10-CM | POA: Insufficient documentation

## 2013-11-28 LAB — RAPID STREP SCREEN (MED CTR MEBANE ONLY): Streptococcus, Group A Screen (Direct): POSITIVE — AB

## 2013-11-28 MED ORDER — PENICILLIN V POTASSIUM 500 MG PO TABS: 500.0000 mg | ORAL_TABLET | Freq: Four times a day (QID) | ORAL | Status: DC

## 2013-11-28 MED ORDER — MAGIC MOUTHWASH
5.0000 mL | Freq: Once | ORAL | Status: AC
  Administered 2013-11-28: 5 mL via ORAL
  Filled 2013-11-28: qty 5

## 2013-11-28 MED ORDER — HYDROCODONE-ACETAMINOPHEN 7.5-325 MG/15ML PO SOLN: 15.0000 mL | Freq: Four times a day (QID) | ORAL | Status: DC | PRN

## 2013-11-28 NOTE — ED Provider Notes (Signed)
Medical screening examination/treatment/procedure(s) were performed by non-physician practitioner and as supervising physician I was immediately available for consultation/collaboration.     Suzi Roots, MD 11/28/13 [phone]

## 2013-11-28 NOTE — ED Notes (Signed)
Pt c/o sore throat x 3 days. Pt had a fever earlier, but it went away. Pt c/o painful swallowing. Pt alert, no acute distress. Skin warm and dry.

## 2013-11-28 NOTE — ED Provider Notes (Signed)
CSN: [id number]     Arrival date & time 11/28/13  1800 History  This chart was scribed for Junius Finner, PA-C, non-physician practitioner working with Suzi Roots, MD by Nicholos Johns, ED scribe. This patient was seen in room WTR9/WTR9 and the patient's care was started at 7:11 PM.   Chief Complaint  Patient presents with  . Sore Throat   The history is provided by the patient. No language interpreter was used.   HPI Comments: Carlos Avila is a 38 y.o. male who presents to the Emergency Department complaining of sore throat; onset 3 days ago. Also has some left sided headache and left sided otalgia. Reports fever initially that has since resolved. States his daughter just got over strept throat. Has been sipping 10 ml amoxicillin that his daughter used to treat her infection. Taking about 5-6, 200 mg ibuprofen to treat with minimal relief. Also used some 800 mg ibuprofen with no relief that he has since ran out of yesterday. No hx of asthma. Allergic to morphine. Denies nausea, vomiting, or abdominal pain  History reviewed. No pertinent past medical history. History reviewed. No pertinent past surgical history. Family History  Problem Relation Age of Onset  . Cancer Other    History  Substance Use Topics  . Smoking status: Current Every Day Smoker    Types: Cigarettes  . Smokeless tobacco: Never Used  . Alcohol Use: Yes     Comment: occasionally    Review of Systems  HENT: Positive for ear pain and sore throat.   Gastrointestinal: Negative for nausea and vomiting.  Neurological: Positive for headaches.  All other systems reviewed and are negative.  Allergies  Morphine and related  Home Medications   Prior to Admission medications   Medication Sig Start Date End Date Taking? Authorizing Provider  acetaminophen (TYLENOL) 325 MG tablet Take 650 mg by mouth every 6 (six) hours as needed.   Yes Historical Provider, MD  amoxicillin (AMOXIL) 125 MG/5ML suspension Take  205 mg by mouth once.   Yes Historical Provider, MD  ibuprofen (ADVIL,MOTRIN) 200 MG tablet Take 1,200-1,400 mg by mouth every 6 (six) hours as needed for moderate pain.   Yes Historical Provider, MD  HYDROcodone-acetaminophen (HYCET) 7.5-325 mg/15 ml solution Take 15 mLs by mouth every 6 (six) hours as needed for moderate pain. 11/28/13   Junius Finner, PA-C  penicillin v potassium (VEETID) 500 MG tablet Take 1 tablet (500 mg total) by mouth 4 (four) times daily. 11/28/13   Junius Finner, PA-C   Triage Vitals: BP 132/86  Pulse 90  Temp(Src) 98.3 F (36.8 C) (Oral)  Resp 18  SpO2 99%  Physical Exam  Nursing note and vitals reviewed. Constitutional: He is oriented to person, place, and time. He appears well-developed and well-nourished.  Pt appears well, non-toxic. NAD  HENT:  Head: Normocephalic and atraumatic.  Mouth/Throat: Uvula is midline. No trismus in the jaw. No uvula swelling. Oropharyngeal exudate, posterior oropharyngeal edema (severe bilaterally) and posterior oropharyngeal erythema present.  Eyes: EOM are normal.  Neck: Normal range of motion. Neck supple. No JVD present. No tracheal deviation present. No thyromegaly present.  Cardiovascular: Normal rate, regular rhythm and normal heart sounds.   Pulmonary/Chest: Effort normal and breath sounds normal. No stridor. No respiratory distress. He has no wheezes. He has no rales. He exhibits no tenderness.  Abdominal: Soft. Bowel sounds are normal. He exhibits no distension and no mass. There is no tenderness. There is no rebound and no guarding.  Musculoskeletal:  Normal range of motion.  Lymphadenopathy:    He has no cervical adenopathy.  Neurological: He is alert and oriented to person, place, and time.  Skin: Skin is warm and dry.  Psychiatric: He has a normal mood and affect. His behavior is normal.   ED Course  Procedures (including critical care time) DIAGNOSTIC STUDIES: Oxygen Saturation is 99% on room air, normal by my  interpretation.    COORDINATION OF CARE: At 7:14 PM: Discussed treatment plan with patient which includes a rapid strep test and pain medication. Patient agrees.   Results for orders placed during the hospital encounter of 11/28/13  RAPID STREP SCREEN      Result Value Ref Range   Streptococcus, Group A Screen (Direct) POSITIVE (*) NEGATIVE   Imaging Review No results found.   EKG Interpretation None      MDM   Final diagnoses:  Strep pharyngitis   Pt c/o sore throat. Reports his daughter dx with strep throat. On exam, pt has bilateral tonsillar erythema, edema and exudate. Uvula midline. No stridor. Pt able to keep down sips of Gatorade. Rapid strep-positive. Rx: lortab and PCN. Advised to take all of his antibiotic. F/u with PCP. Return precautions provided. Pt verbalized understanding and agreement with tx plan.   I personally performed the services described in this documentation, which was scribed in my presence. The recorded information has been reviewed and is accurate.      Junius FinnerErin O'Malley, PA-C 11/28/13 [phone]

## 2013-11-28 NOTE — Discharge Instructions (Signed)
Take 400-800mg  Ibuprofen (Motrin) every 6-8 hours for fever and pain  Alternate with Tylenol  Give 500-1000mg  Tylenol every 4-6 hours as needed for fever and pain  Follow-up with your primary care provider next week for recheck of symptoms if not improving.  Be sure to drink plenty of fluids and rest, at least 8hrs of sleep a night, preferably more while you are sick. Return to the ED if you cannot keep down fluids/signs of dehydration, fever not reducing with Tylenol, difficulty breathing/wheezing, stiff neck, worsening condition, or other concerns (see below)

## 2015-05-30 IMAGING — CR DG HAND COMPLETE 3+V*L*
3 series · 3 of 3 positions shown · non-contrast
Comparison: None.

CLINICAL DATA: Laceration near first metacarpal phalangeal joint on
the anterior surface.

LEFT HAND - COMPLETE 3+ VIEW

[x hand pa left]
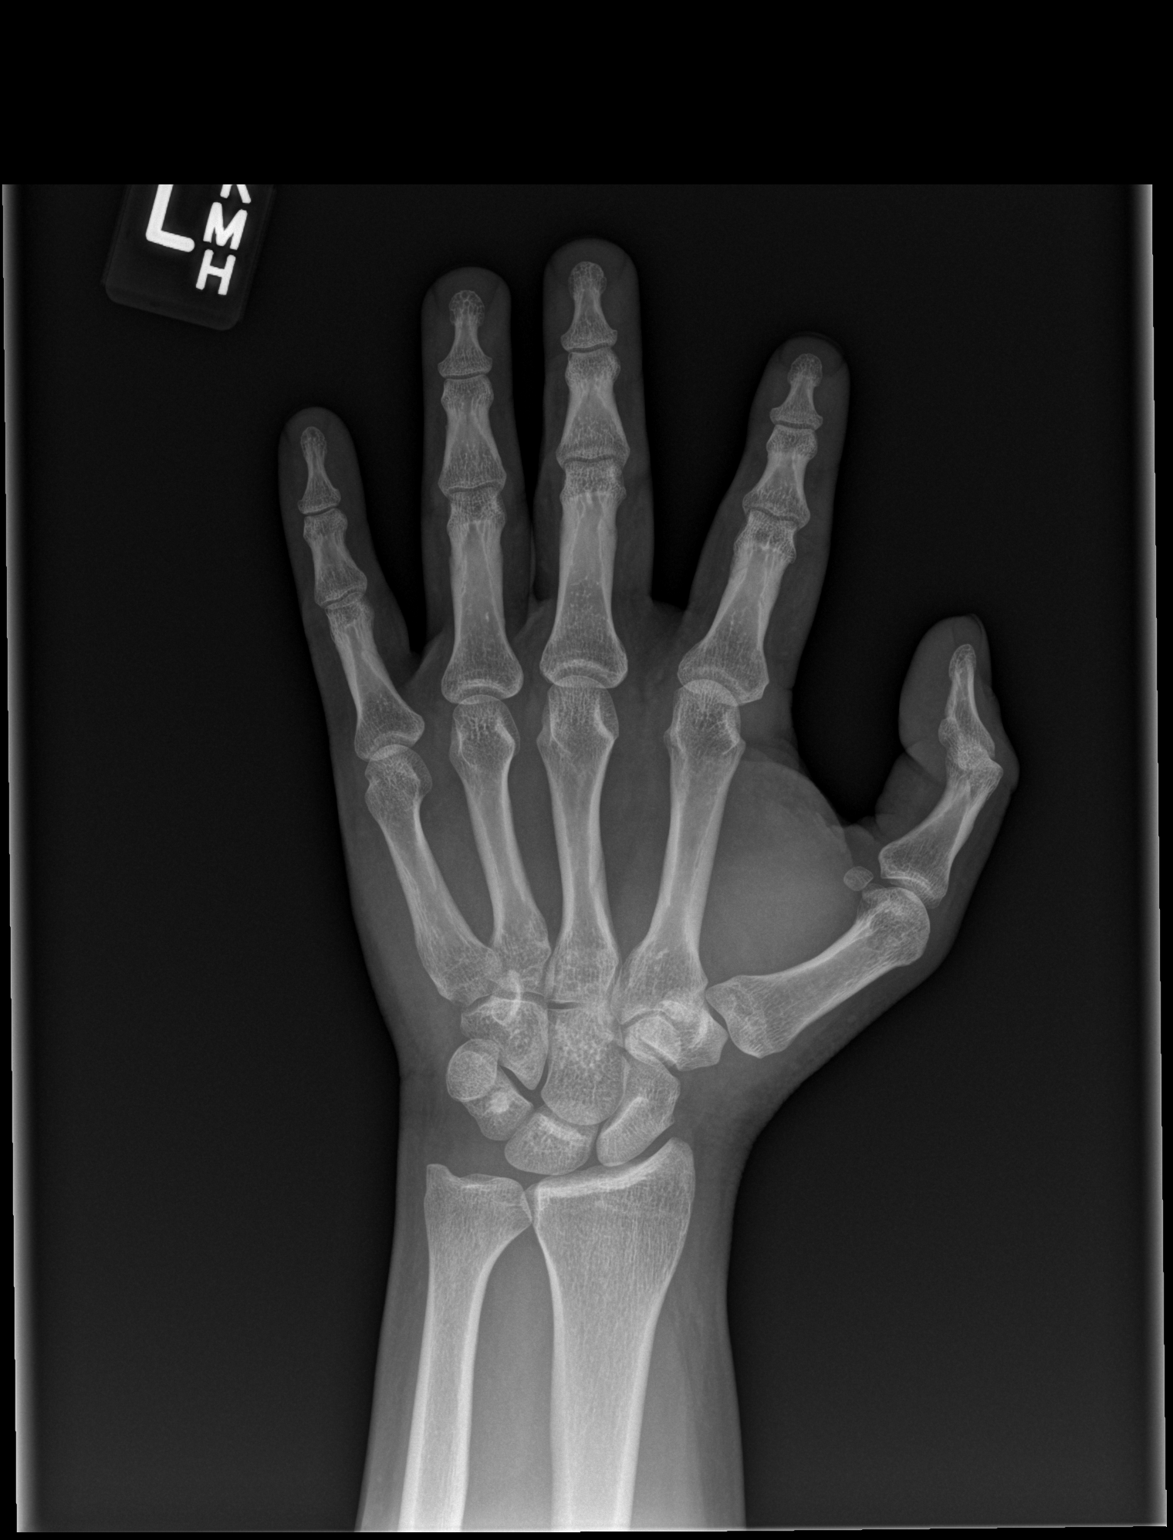

[x hand obl left]
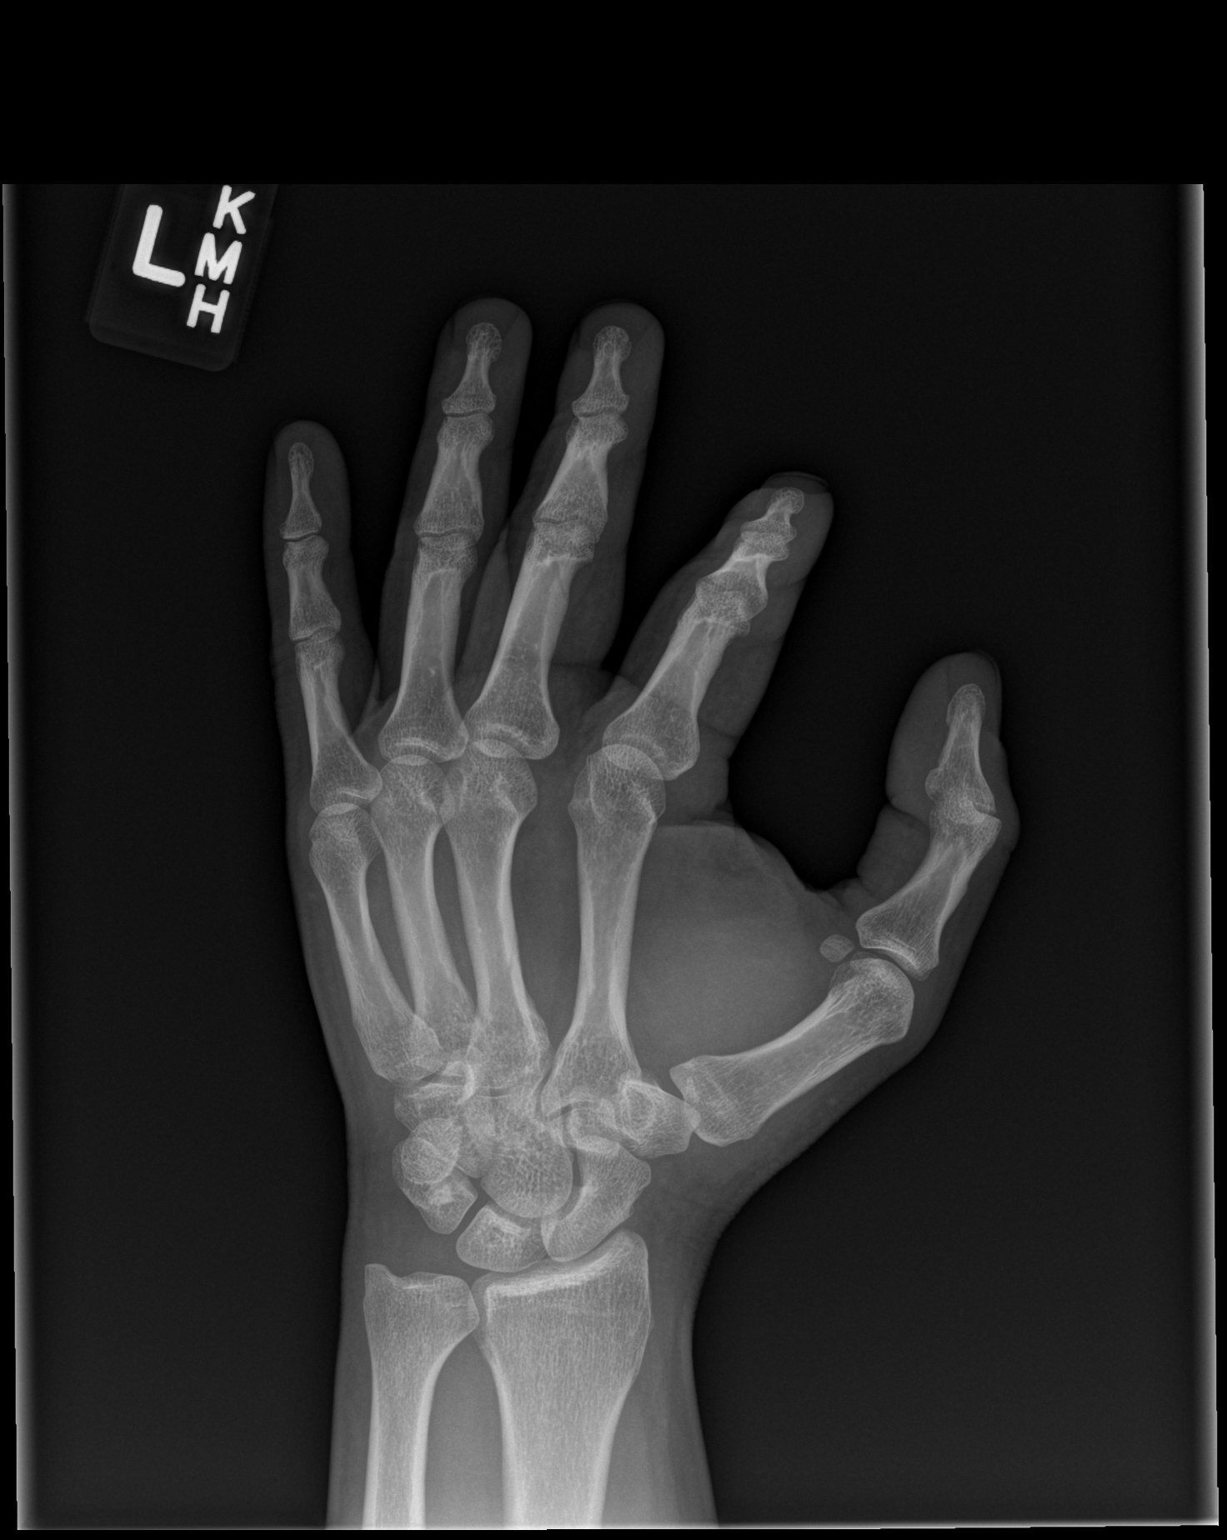

[x hand lat left]
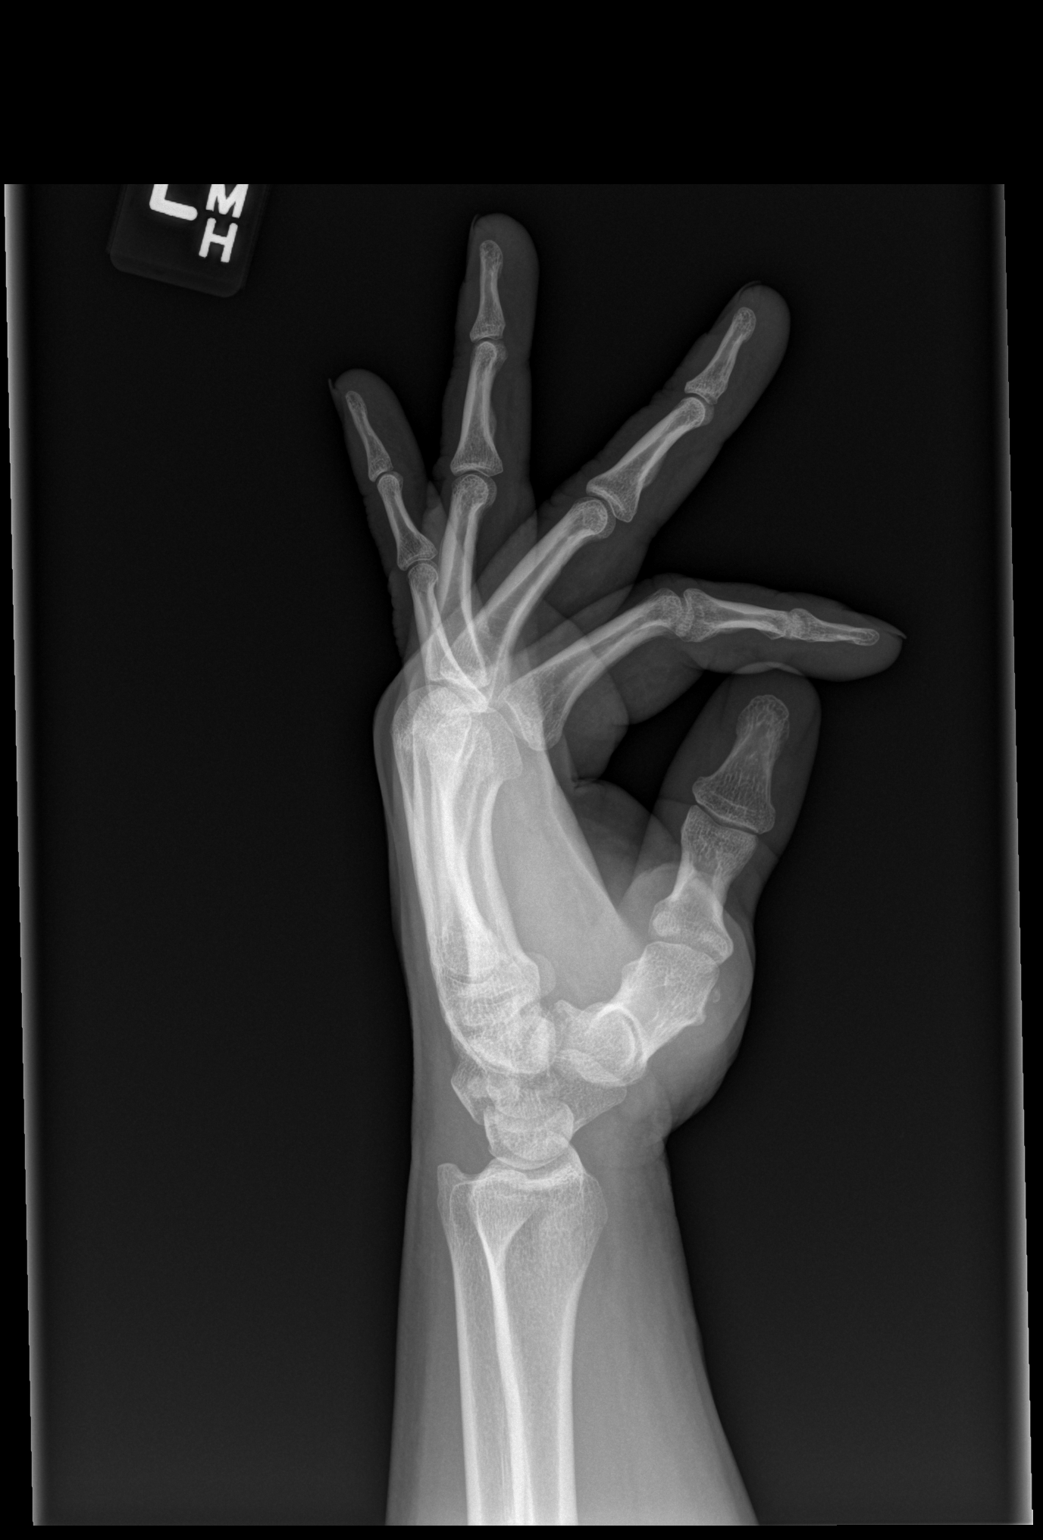

[3 of 3 positions shown; findings below may reference images not displayed]

FINDINGS: No acute fracture or dislocation.  No radio-opaque
foreign body.
IMPRESSION: No acute osseous abnormality.

## 2016-06-27 ENCOUNTER — Other Ambulatory Visit: Payer: Self-pay | Admitting: Nurse Practitioner

## 2016-06-27 DIAGNOSIS — R1909 Other intra-abdominal and pelvic swelling, mass and lump: Secondary | ICD-10-CM

## 2016-07-03 ENCOUNTER — Ambulatory Visit
Admission: RE | Admit: 2016-07-03 | Discharge: 2016-07-03 | Disposition: A | Discharge status: Home / Self Care | Payer: Medicaid Other | Source: Ambulatory Visit | Attending: Nurse Practitioner | Admitting: Nurse Practitioner

## 2016-07-03 DIAGNOSIS — R1909 Other intra-abdominal and pelvic swelling, mass and lump: Secondary | ICD-10-CM

## 2016-07-04 ENCOUNTER — Other Ambulatory Visit: Payer: Self-pay

## 2016-07-05 ENCOUNTER — Other Ambulatory Visit: Payer: Self-pay | Admitting: Nurse Practitioner

## 2016-07-05 DIAGNOSIS — R1909 Other intra-abdominal and pelvic swelling, mass and lump: Secondary | ICD-10-CM

## 2016-07-08 ENCOUNTER — Ambulatory Visit
Admission: RE | Admit: 2016-07-08 | Discharge: 2016-07-08 | Disposition: A | Discharge status: Home / Self Care | Payer: Medicaid Other | Source: Ambulatory Visit | Attending: Nurse Practitioner | Admitting: Nurse Practitioner

## 2016-07-08 DIAGNOSIS — R1909 Other intra-abdominal and pelvic swelling, mass and lump: Secondary | ICD-10-CM

## 2016-07-10 ENCOUNTER — Encounter (HOSPITAL_COMMUNITY): Payer: Self-pay

## 2016-07-18 ENCOUNTER — Emergency Department (HOSPITAL_COMMUNITY)
Admission: EM | Admit: 2016-07-18 | Discharge: 2016-07-18 | Disposition: A | Discharge status: Home / Self Care | Payer: Medicaid Other | Attending: Emergency Medicine | Admitting: Emergency Medicine

## 2016-07-18 ENCOUNTER — Ambulatory Visit (INDEPENDENT_AMBULATORY_CARE_PROVIDER_SITE_OTHER): Payer: Self-pay | Admitting: Orthopedic Surgery

## 2016-07-18 ENCOUNTER — Encounter (HOSPITAL_COMMUNITY): Payer: Self-pay

## 2016-07-18 DIAGNOSIS — R1031 Right lower quadrant pain: Secondary | ICD-10-CM | POA: Insufficient documentation

## 2016-07-18 DIAGNOSIS — I10 Essential (primary) hypertension: Secondary | ICD-10-CM | POA: Insufficient documentation

## 2016-07-18 DIAGNOSIS — F17210 Nicotine dependence, cigarettes, uncomplicated: Secondary | ICD-10-CM | POA: Diagnosis not present

## 2016-07-18 DIAGNOSIS — Z79899 Other long term (current) drug therapy: Secondary | ICD-10-CM | POA: Insufficient documentation

## 2016-07-18 HISTORY — DX: Essential (primary) hypertension: I10

## 2016-07-18 HISTORY — DX: Unilateral inguinal hernia, without obstruction or gangrene, not specified as recurrent: K40.90

## 2016-07-18 LAB — CBC
HCT: 46.6 % (ref 39.0–52.0)
Hemoglobin: 16.3 g/dL (ref 13.0–17.0)
MCH: 30.4 pg (ref 26.0–34.0)
MCHC: 35 g/dL (ref 30.0–36.0)
MCV: 86.9 fL (ref 78.0–100.0)
Platelets: 281 10*3/uL (ref 150–400)
RBC: 5.36 MIL/uL (ref 4.22–5.81)
RDW: 12.8 % (ref 11.5–15.5)
WBC: 8 10*3/uL (ref 4.0–10.5)

## 2016-07-18 LAB — COMPREHENSIVE METABOLIC PANEL
ALBUMIN: 4.1 g/dL (ref 3.5–5.0)
ALK PHOS: 83 U/L (ref 38–126)
ALT: 28 U/L (ref 17–63)
AST: 28 U/L (ref 15–41)
Anion gap: 9 (ref 5–15)
BUN: 12 mg/dL (ref 6–20)
CALCIUM: 9.4 mg/dL (ref 8.9–10.3)
CO2: 28 mmol/L (ref 22–32)
Chloride: 103 mmol/L (ref 101–111)
Creatinine, Ser: 0.81 mg/dL (ref 0.61–1.24)
GFR calc Af Amer: >60 mL/min (ref >60)
GFR calc non Af Amer: >60 mL/min (ref >60)
GLUCOSE: 119 mg/dL — AB (ref 65–99)
Potassium: 3.7 mmol/L (ref 3.5–5.1)
Sodium: 140 mmol/L (ref 135–145)
TOTAL PROTEIN: 7.9 g/dL (ref 6.5–8.1)
Total Bilirubin: 0.5 mg/dL (ref 0.3–1.2)

## 2016-07-18 LAB — URINALYSIS, ROUTINE W REFLEX MICROSCOPIC
Bilirubin Urine: NEGATIVE
Glucose, UA: NEGATIVE mg/dL
HGB URINE DIPSTICK: NEGATIVE
Ketones, ur: NEGATIVE mg/dL
Leukocytes, UA: NEGATIVE
NITRITE: NEGATIVE
Protein, ur: NEGATIVE mg/dL
Specific Gravity, Urine: 1.012 (ref 1.005–1.030)
pH: 6 (ref 5.0–8.0)

## 2016-07-18 LAB — LIPASE, BLOOD: Lipase: 21 U/L (ref 11–51)

## 2016-07-18 MED ORDER — HYDROCODONE-ACETAMINOPHEN 5-325 MG PO TABS: 1.0000 | ORAL_TABLET | Freq: Four times a day (QID) | ORAL | 0 refills | Status: DC | PRN

## 2016-07-18 MED ORDER — IBUPROFEN 600 MG PO TABS: 600.0000 mg | ORAL_TABLET | Freq: Four times a day (QID) | ORAL | 0 refills | Status: DC | PRN

## 2016-07-18 MED ORDER — OXYCODONE-ACETAMINOPHEN 5-325 MG PO TABS
1.0000 | ORAL_TABLET | ORAL | Status: DC | PRN
  Administered 2016-07-18: 1 via ORAL
  Filled 2016-07-18: qty 1

## 2016-07-18 NOTE — ED Notes (Signed)
PT ADVISED NOT TO DRIVE AFTER RECEIVING THE MEDICATION. PT VERBALIZED AN UNDERSTANDING. 

## 2016-07-18 NOTE — ED Triage Notes (Signed)
PT C/O WORSENING RIGHT GROIN PAIN SINCE LAST NIGHT. PT STS HE HAS A KNOWN INGUINAL HERNIA AND IS SCHEDULED TO SEE A UROLOGIST ON 1/24. HE CAME TODAY BECAUSE HE STS IT IS GETTING LARGER AND THE PAIN IS INCREASING RADIATING TO THE RIGHT HIP. DENIES INJURY. HE ALSO STS HE HAS BEEN CONSTIPATED.

## 2016-07-18 NOTE — ED Notes (Signed)
PA at bedside.

## 2016-07-18 NOTE — ED Provider Notes (Signed)
[facility]-EMERGENCY DEPT Provider Note   CSN: [id number] Arrival date & time: 07/18/16  1512   History   Chief Complaint Chief Complaint  Patient presents with  . Groin Pain    RIGHT    HPI Carlos Avila is a 41 y.o. male who presents with right sided groin pain. PMH significant for right inguinal hernia. He has appt with urology on 1/24. He states that over the past month and a half he has had worsening right groin pain which he attributes to a hernia. He states it is worse with certain positions and movements. He feels it "moving". It is sometimes a burning/sharp pain and it radiates to his right hip. He denies fever, abdominal pain, N/V/D, dysuria, testicular pain or penile discharge. He had a CT of his pelvis on 1/8 which confirmed the hernia and was given appt with urology. Reports he has been "constipated" since he normally has 4 BMs a day and has only had one today. He is able to pass gas but states it is "harder". He has been taking Ibuprofen 600mg  TID with no relief.  HPI  Past Medical History:  Diagnosis Date  . Hypertension   . Inguinal hernia    RIGHT    There are no active problems to display for this patient.   Past Surgical History:  Procedure Laterality Date  . WISDOM TOOTH EXTRACTION       Home Medications    Prior to Admission medications   Medication Sig Start Date End Date Taking? Authorizing Provider  clonazePAM (KLONOPIN) 1 MG tablet Take 1 mg by mouth 2 (two) times daily as needed for anxiety. 06/19/16 06/19/17 Yes Historical Provider, MD  acetaminophen (TYLENOL) 325 MG tablet Take 650 mg by mouth every 6 (six) hours as needed.    Historical Provider, MD  amoxicillin (AMOXIL) 125 MG/5ML suspension Take 205 mg by mouth once.    Historical Provider, MD  HYDROcodone-acetaminophen (HYCET) 7.5-325 mg/15 ml solution Take 15 mLs by mouth every 6 (six) hours as needed for moderate pain. 11/28/13   Junius Finner, PA-C  ibuprofen (ADVIL,MOTRIN) 200 MG tablet  Take 1,200-1,400 mg by mouth every 6 (six) hours as needed for moderate pain.    Historical Provider, MD  penicillin v potassium (VEETID) 500 MG tablet Take 1 tablet (500 mg total) by mouth 4 (four) times daily. 11/28/13   Junius Finner, PA-C    Family History Family History  Problem Relation Age of Onset  . Cancer Other     Social History Social History  Substance Use Topics  . Smoking status: Current Every Day Smoker    Types: Cigarettes  . Smokeless tobacco: Never Used  . Alcohol use Yes     Comment: occasionally     Allergies   Morphine and related   Review of Systems Review of Systems  Constitutional: Negative for chills and fever.  Gastrointestinal: Negative for abdominal pain, nausea and vomiting.  Genitourinary: Negative for difficulty urinating, discharge, dysuria, flank pain, frequency, scrotal swelling and testicular pain.       +Right groin pain  All other systems reviewed and are negative.    Physical Exam Updated Vital Signs BP 139/96 (BP Location: Left Arm)   Pulse 91   Temp 97.9 F (36.6 C) (Oral)   Resp 16   Ht 5\' 5"  (1.651 m)   Wt 68 kg   SpO2 99%   BMI 24.96 kg/m   Physical Exam  Constitutional: He is oriented to person, place, and time. He  appears well-developed and well-nourished. No distress.  HENT:  Head: Normocephalic and atraumatic.  Eyes: Conjunctivae are normal. Pupils are equal, round, and reactive to light. Right eye exhibits no discharge. Left eye exhibits no discharge. No scleral icterus.  Neck: Normal range of motion.  Cardiovascular: Normal rate.   Pulmonary/Chest: Effort normal. No respiratory distress.  Abdominal: Soft. Bowel sounds are normal. He exhibits no distension and no mass. There is no tenderness. There is no rebound and no guarding. No hernia.  Genitourinary:  Genitourinary Comments: No inguinal lymphadenopathy noted. Significant tenderness of inguinal area. He refuses palpation of hernia. Normal circumcised penis  free of lesions or rash. Testicles are nontender with normal lie. Normal scrotal appearance. No obvious discharge noted. Chaperone present during exam.    Neurological: He is alert and oriented to person, place, and time.  Skin: Skin is warm and dry.  Psychiatric: He has a normal mood and affect. His behavior is normal.  Nursing note and vitals reviewed.    ED Treatments / Results  Labs (all labs ordered are listed, but only abnormal results are displayed) Labs Reviewed  COMPREHENSIVE METABOLIC PANEL - Abnormal; Notable for the following:       Result Value   Glucose, Bld 119 (*)    All other components within normal limits  LIPASE, BLOOD  CBC  URINALYSIS, ROUTINE W REFLEX MICROSCOPIC    EKG  EKG Interpretation None       Radiology No results found.  Procedures Procedures (including critical care time)  Medications Ordered in ED Medications  oxyCODONE-acetaminophen (PERCOCET/ROXICET) 5-325 MG per tablet 1 tablet (1 tablet Oral Given 07/18/16 1543)     Initial Impression / Assessment and Plan / ED Course  I have reviewed the triage vital signs and the nursing notes.  Pertinent labs & imaging results that were available during my care of the patient were reviewed by me and considered in my medical decision making (see chart for details).  41 year old male presents with inguinal hernia pain. He is mildly hypertensive but otherwise vitals are normal. Pt refuses exam of hernia other than light palpation. Discussed I am not able to fully evaluate unless I can feel the hernia. Offered additional pain medicine however patient refused. Otherwise he has no findings of strangulation/incarceration or bowel obstruction. He has had a BM while in the ED. Pain treated in ED. Labs are unremarkable. UA is clean. Rx for pain medicine given. Advised return for worsening symptoms and follow up with Urology. Patient is NAD, non-toxic, with stable VS. Patient is informed of clinical course,  understands medical decision making process, and agrees with plan. Opportunity for questions provided and all questions answered.    Final Clinical Impressions(s) / ED Diagnoses   Final diagnoses:  Right groin pain    New Prescriptions New Prescriptions   No medications on file     Bethel BornKelly Marie Selma Rodelo, PA-C 07/18/16 2102    Melene Planan Floyd, DO 07/18/16 2302

## 2016-07-18 NOTE — Discharge Instructions (Signed)
Please follow up with urology Continue Ibuprofen with pain medicine. Take with food Take stool softener to help with constipation Return for worsening symptoms

## 2016-09-09 ENCOUNTER — Ambulatory Visit (HOSPITAL_COMMUNITY): Payer: Medicaid Other | Admitting: Psychiatry

## 2016-09-09 ENCOUNTER — Ambulatory Visit (HOSPITAL_COMMUNITY): Payer: Self-pay | Admitting: Psychiatry

## 2016-10-01 ENCOUNTER — Encounter (HOSPITAL_COMMUNITY): Payer: Self-pay | Admitting: Psychiatry

## 2016-10-01 ENCOUNTER — Ambulatory Visit (INDEPENDENT_AMBULATORY_CARE_PROVIDER_SITE_OTHER): Payer: Medicaid Other | Admitting: Psychiatry

## 2016-10-01 VITALS — BP 130/78 | HR 76 | Ht 60.0 in | Wt 156.0 lb

## 2016-10-01 DIAGNOSIS — Z818 Family history of other mental and behavioral disorders: Secondary | ICD-10-CM

## 2016-10-01 DIAGNOSIS — F902 Attention-deficit hyperactivity disorder, combined type: Secondary | ICD-10-CM | POA: Diagnosis not present

## 2016-10-01 DIAGNOSIS — Z811 Family history of alcohol abuse and dependence: Secondary | ICD-10-CM | POA: Diagnosis not present

## 2016-10-01 DIAGNOSIS — F17210 Nicotine dependence, cigarettes, uncomplicated: Secondary | ICD-10-CM

## 2016-10-01 DIAGNOSIS — F411 Generalized anxiety disorder: Secondary | ICD-10-CM | POA: Diagnosis not present

## 2016-10-01 DIAGNOSIS — Z888 Allergy status to other drugs, medicaments and biological substances status: Secondary | ICD-10-CM | POA: Diagnosis not present

## 2016-10-01 DIAGNOSIS — Z79899 Other long term (current) drug therapy: Secondary | ICD-10-CM

## 2016-10-01 MED ORDER — CLONIDINE HCL 0.1 MG PO TABS: 0.1000 mg | ORAL_TABLET | Freq: Every day | ORAL | 2 refills | Status: AC

## 2016-10-01 MED ORDER — PAROXETINE HCL 20 MG PO TABS: 20.0000 mg | ORAL_TABLET | Freq: Every day | ORAL | 2 refills | Status: DC

## 2016-10-01 NOTE — Progress Notes (Signed)
Psychiatric Initial Adult Assessment   Patient Identification: Carlos Avila MRN:  [id number] Date of Evaluation:  10/01/2016 Referral Source: pcp, self Chief Complaint:  anxiety, panic Visit Diagnosis:    ICD-9-CM ICD-10-CM   1. Attention deficit hyperactivity disorder (ADHD), combined type 314.01 F90.2 cloNIDine (CATAPRES) 0.1 MG tablet  2. GAD (generalized anxiety disorder) 300.02 F41.1 PARoxetine (PAXIL) 20 MG tablet     cloNIDine (CATAPRES) 0.1 MG tablet   History of Present Illness:  Carlos Avila is a 41 year old male, currently married, with 3 children, who works at [employer], who presents today for psychiatric intake assessment. He reports that he has been off of clonazepam for the past 6-8 weeks, and he has continued to have significant anxiety and panic episodes generally about 3-4 times per week. He reports that he gets very anxious and worried if he has forgotten to do something in particular, or if he made a mistake in his home or work life, and then will work himself up into a anxious state. Notably, the patient is very youthful in his interaction, and behaves in a less mature manner than what would be expected for his age. He is wearing baggy jeans, a backwards had, and an unkempt T-shirt.  Speaking with Mr. Nolt, I learned about his past childhood history, and he admits that he was quite a "hell raiser" as a child, and frequently skipped school, experimented with marijuana and alcohol, experimented with risky behaviors such as 4 wheelers, mountain climbing, motorcycles, and was very much into being the class clown and stating. He reports that he didn't like any limits or rules, and he was not at all focused on school. He reports that he was frequently in trouble with his parents for breaking house rules, and would get spanked or whooped when he would act out. He reports that his parents were very proper and religious, and he was a bit of a "black sheep" with his  energy and sense of humor. He reports that he continues to be very silly and humerus, and is often making lots of jokes, talking a lot, distractible. He reports that he has trouble completing tasks, and will frequently get off task because he gets distracted by another responsibility he needs to attend to.  He admits to being quite forgetful. He reports that at the end of the day he worries that he is not adequately done all of his responsibilities.  He reports that he has struggled with panic attacks for the past 1-2 years, and he had been placed on clonazepam. He had been tried on BuSpar as well, but this was not very effective. He reports that with clonazepam, he feels like he can calm down and focus on his tasks. I spent time discussing the potential for ADHD, and provided the patient some educational material about this. He presents a childhood history, and an adult history consistent with ADHD, which is often comorbid with anxiety. The patient was receptive to this. We agreed that we need to first initially address his anxiety and worry symptoms, and then we can consider whether a stimulant would be beneficial. He reports that he has trouble sleeping, because he runs through his items for the day that he might of messed up on or worries about things that are coming up the next day, in terms of his responsibilities. We discussed the use of clonidine to help settle his mind, and help with psychomotor restlessness as well. Educated him on the risks and benefits of clonidine.  Previously tried Zoloft 50 mg for approximately 2 weeks then self-discontinued due to lack of improvement.  Spent time discussing the pharmacology of Paxil, and the risks and benefits of this. Agreed to start at 10 mg, and increase to 20 mg in one week.  We also spent time discussing the use of clonidine for ADHD related sleep difficulties. Ultimately I believe the patient will benefit from consideration of a stimulant for ADHD symptoms,  as this seems to be a significant contributor to his anxiety and worry symptoms.  Regarding substance use, he reports that he drinks about 2 fingers of whiskey about 3-5 nights per week. He does not drink daily. He does not drink beyond what his intentions are. He does not get blackout drunk, and does not drink in the morning. He reports that he has a family history of alcoholism and his grandfather and uncle, so he is always been very cautious about his alcohol use.  Fill Date Product, Str, Form Qty Days Pt ID Prescriber Written RX# N/R* Pharm **MED+ ---------- -------------------------------- ------ ---- --------- ---------- ---------- ------------ ----- --------- ------ 08/09/2016 CLONAZEPAM 1 MG TABLET 30.00 15 [id number] 08/01/2016 [id number] N [id number] 00.0 08/05/2016 HYDROCODONE-ACETAMIN 5-325 MG 20.00 4 [id number] 08/05/2016 [id number] N [id number] 25.0 07/19/2016 HYDROCODONE-ACETAMIN 5-325 MG 20.00 5 [id number] 07/18/2016 [id number] N [id number] 20.0 07/10/2016 CLONAZEPAM 1 MG TABLET 30.00 15 [id number] 06/19/2016 [id number] N [id number] 00.0 05/22/2016 CLONAZEPAM 1 MG TABLET 30.00 15 [id number] 05/21/2016 [id number] N [id number] 00.0 04/05/2016 CLONAZEPAM 1 MG TABLET 60.00 30 [id number] 02/20/2016 [id number] N [id number] 00.0 03/09/2016 CLONAZEPAM 1 MG TABLET 60.00 30 [id number] 02/20/2016 [id number] N [id number] 00.0 02/09/2016 CLONAZEPAM 1 MG TABLET 60.00 30 [id number] 11/20/2015 [id number] R [id number] 00.0 12/08/2015 HYDROCODONE-ACETAMIN 5-325 MG 14.00 4 [id number] 12/08/2015 [id number] N [id number] 17.5 11/20/2015 CLONAZEPAM 1 MG TABLET 60.00 30 [id number] 11/20/2015 [id number] N [id number] 00.0 08/04/2015 CLONAZEPAM 1 MG TABLET 30.00 15 [id number] 08/04/2015 [id number] N [id number] 00.0 07/12/2015 CLONAZEPAM 1 MG TABLET 30.00 15 [id number] 07/12/2015 [id number] N [id number] 00.0 06/14/2015 CLONAZEPAM 1 MG TABLET 30.00 15 [id number]  [id number] 06/08/2015 [id number] N [id number] 00.0 06/08/2015 TRAMADOL HCL 50 MG TABLET 60.00 10 [id number] 05/01/2015 [id number] N [id number] 30.0 05/16/2015 CLONAZEPAM 1 MG TABLET 30.00 15 [id number] 05/16/2015 [id number] N [id number] 00.0 04/13/2015 CLONAZEPAM 1 MG TABLET 30.00 15 [id number] 04/13/2015 [id number] N [id number] 00.0 03/18/2015 CLONAZEPAM 0.5 MG TABLET 30.00 15 [id number] 03/18/2015 [id number] N [id number] 00.0 08/02/2014 OXYCODONE-ACETAMINOPHEN 5-325 20.00 5 [id number] 08/02/2014 [id number] N [id number] 30.0 07/25/2014 OXYCODONE-ACETAMINOPHEN 5-325 10.00 3 [id number] 07/25/2014 [id number] N [id number] 25.0 07/20/2014 HYDROCODONE-ACETAMIN 5-325 MG 20.00 5 [id number] 07/20/2014 [id number] N [id number] 20.0 07/08/2014 TRAMADOL HCL 50 MG TABLET 60.00 30 [id number] 07/08/2014 [id number] N [id number] 10.0 11/29/2013 HYDROCODONE-ACETAMN 7.5-325/15 100.00 2 [id number] 11/28/2013 [id number] N [id number] 12/07/2012 OXYCODONE-ACETAMINOPHEN 5-325 3.00 1 [id number] 12/07/2012 [id number] N [id number] 22.5 11/29/2012 OXYCODONE-ACETAMINOPHEN 5-325 6.00 1 [id number] 11/29/2012 [id number] N [id number] 45.0 *N/R N=New R=Refill +MED Daily Prescribers for prescriptions listed ---------------------------------------------------------------------------------------------------------------------------------- [id number] Dierdre Forth, Laurena Bering, DEPARTMENT O, [phone] Bobbye [place] [address] [id number] Vida Roller, MD; [facility] EMERGENCY DEPT [facility], [facility] EMERGENCY DEPT Laney Pastor [address] [id number] Thereasa Distance [place], [address], [place] [address] [id number] Horald Pollen, PAC; [facility] [address], [place] [address] [id number] SIMONCIC, STEFAN J DDS; [facility], [address], [place] [address] [id number] Isabell Jarvis, PAC; [facility], [address], [place] [address] [id number] Iona Hansen NP; [address] [place], [place] [address] [id number] Matilde Sprang MD; [address], [address] [facility], [place] [address] [id number] GEKAS, KELLY MARIE; [facility], [address], [place] [address] [id number] Sherril Cong MSN; [address] [place] [address] Pharmacies that dispensed prescriptions listed ---------------------------------------------------------------------------------------------------------------------------------- [id number] [facility] DBA: [facility] [id number], [address], [place] [address], [id number] [facility]Y 03-1841; Andi Devon, [place] [address], [phone] [facility]; [facility], [phone], [place] [address], [id number] [facility] [id number]; [address] [place] [address], [id number] [facility] 03-3304; [address], [place] [address], [id number] [facility] PHARMACY 03-1297; [address], [place] [address], Patients that match search criteria ---------------------------------------------------------------------------------------------------------------------------------- [id number] Rundle Dorsie, DOB Oct 27, 1975; [address], [place] [address] [id number] Seidner Vaughan, DOB 08-Feb-1976; [address], [facility] [place] [address] [id number] Peitz Xabi, DOB 1975-09-25; [address], [place] [address] [id number] Shelp Brett, DOB 02/15/1976; [address], [place] [address] MED Summary This section displays cumulative MED values by unique recipient. The MED Max value is the maximum occurrence of cumulative MED sustained for any 3 consecutive  days. This value is calculated based on prescriptions dispensed during the date range requested. ----------------------------------------------------------------------------------------------------------------------------------- 40 Torrence, Ayomide; Jun 26, 1976; [address], [place]  [address] 0 Bashor, Jemuel; 10-Sep-1975; [address] [place], [place] [address]  Associated Signs/Symptoms: Depression Symptoms:  psychomotor agitation, anxiety, panic attacks, disturbed sleep, decreased appetite, (Hypo) Manic Symptoms:  Irritable Mood, Anxiety Symptoms:  Excessive Worry, Panic Symptoms, Psychotic Symptoms:  none PTSD Symptoms: Negative  Past Psychiatric History: Psychiatric history of treatment with benzodiazepines for anxiety and panic. He also has a childhood psychiatric history, which she is not totally sure of, but he knows that he had seen a therapist for his acting out behaviors. He has never had a psychiatric hospitalization or suicidality.  Previous Psychotropic Medications: Yes   Substance Abuse History in the last 12 months:  No.  Consequences of Substance Abuse: Negative  Past Medical History:  Past Medical History:  Diagnosis Date  . Hypertension   . Inguinal hernia    RIGHT    Past Surgical History:  Procedure Laterality Date  . WISDOM TOOTH EXTRACTION      Family Psychiatric History: Family history of anxiety and alcoholism.   Family History:  Family History  Problem Relation Age of Onset  . Cancer Other     Social History:   Social History   Social History  . Marital status: Divorced    Spouse name: N/A  . Number of children: N/A  . Years of education: N/A   Social History Main Topics  . Smoking status: Current Every Day Smoker    Types: Cigarettes  . Smokeless tobacco: Never Used  . Alcohol use Yes     Comment: occasionally  . Drug use: No  . Sexual activity: Yes   Other Topics Concern  . None   Social History Narrative   . 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. He is married. Has 2 stepchildren and one biological child, age 43  Allergies:   Allergies  Allergen Reactions  .  Morphine And Related     "real bad stomach ache"    Metabolic Disorder Labs: No results found for: HGBA1C, MPG No results found for: PROLACTIN No results found for: CHOL, TRIG, HDL, CHOLHDL, VLDL, LDLCALC   Current Medications: Current Outpatient Prescriptions  Medication Sig Dispense Refill  . cloNIDine (CATAPRES) 0.1 MG tablet Take 1 tablet (0.1 mg total) by mouth at bedtime. 30 tablet 2  . hydrOXYzine (ATARAX/VISTARIL) 25 MG tablet Take 25 mg by mouth daily as needed for anxiety or itching.     Marland Kitchen PARoxetine (PAXIL) 20 MG tablet Take 1 tablet (20 mg total) by mouth daily. Take 1/2 tablet for 1 week, then increase to 1 tablet 30 tablet 2   No current facility-administered medications for this visit.    Neurologic: Headache: Negative Seizure: Negative Paresthesias:Negative  Musculoskeletal: Strength & Muscle Tone: within normal limits Gait & Station: normal Patient leans: N/A  Psychiatric Specialty Exam: Review of Systems  Constitutional: Negative.   HENT: Negative.   Respiratory: Negative.   Cardiovascular: Negative.   Gastrointestinal: Negative.   Genitourinary: Negative.   Musculoskeletal: Negative.   Neurological: Negative.   Psychiatric/Behavioral: Positive for memory loss. Negative for depression, hallucinations, substance abuse and suicidal ideas. The patient is nervous/anxious and has insomnia.     Blood pressure 130/78, pulse 76, height 5' (1.524 m), weight 156 lb (70.8 kg).Body mass index is 30.47 kg/m.  General Appearance: Casual  Eye Contact:  Good  Speech:  Clear and Coherent  Volume:  Normal  Mood:  Anxious and restless, hyper  Affect:  Congruent  Thought Process:  Coherent and Goal Directed  Orientation:  Full (Time, Place, and Person)  Thought  Content:  Logical  Suicidal Thoughts:  No  Homicidal Thoughts:  No  Memory:  Immediate;   Fair  Judgement:  Fair  Insight:  Shallow  Psychomotor Activity:  Increased and Restlessness  Concentration:  Concentration: Fair and Attention Span: Fair  Recall:  NA  Fund of Knowledge:Fair  Language: Fair  Akathisia:  Negative  Handed:  Right  AIMS (if indicated):  na  Assets:  Communication Skills Desire for Improvement Financial Resources/Insurance Housing Intimacy Leisure Time Physical Health Resilience Social Support Talents/Skills Transportation  ADL's:  Intact  Cognition: WNL  Sleep:  5-8 hours    Treatment Plan Summary: Amahd Morino is a 41 year old male with a history of anxiety and panic attacks who presents today for psychiatric intake assessment. He presents much younger than his stated age, and during our interaction is distractible, makes jokes, is easily off topic with tangential remarks, stands up and sits down frequently due to restlessness, and notices objects throughout the room that are irrelevant for the clinical encounter. He presents with a history of childhood ADHD, and presents with adulthood ADHD symptoms as well. The comorbidity of ADHD and anxiety in adulthood is quite high, and I prefer to first address the anxiety component before considering stimulant trials. I spent time educating the patient about my impression, and provided him reading material for ADHD. We agreed to start Paxil for anxiety and panic, and use clonidine at a low-dose for nighttime psychophysiologic insomnia. He does not present with any acute safety issues, or substance use issues. He has no history of mania or psychosis. We'll follow-up in 6 weeks.  1. Attention deficit hyperactivity disorder (ADHD), combined type   2. GAD (generalized anxiety disorder)    Patient is not taking clonazepam for the past 6 weeks. We will not be restarting at this  time Initiate Paxil 10 mg for 1 week,  then 20 mg daily Clonidine 0.1 mg daily at bedtime for sleep Consider stimulant trial or further neuropsych testing for ADHD as clinically indicated Return to clinic in 6 weeks Educational material provided to patient regarding ADHD  Burnard Leigh, MD 4/3/20182:22 PM

## 2016-10-01 NOTE — Patient Instructions (Signed)
Start 1/2 tablet of paxil for about 5-7 days, then increase to 1 tablet.  Always take in the morning, not at night because it can cause restlessness  Take clonidine 0.1 mg about 1 hour before bedtime  Come back and see me next month

## 2016-10-03 ENCOUNTER — Telehealth (HOSPITAL_COMMUNITY): Payer: Self-pay

## 2016-10-03 NOTE — Telephone Encounter (Signed)
Patient is calling about his medication - he said that he is having the same effect as Buspar and Zoloft. He said it kept him up all night, he is grinding his teeth, and he feels weird. He would like to call back - he does not want to take anything else - he did mention that the Clonodine did work to help him fall asleep, he just was not able to stay asleep due to the Paxil. Patient says that he would like Klonopin, he knows it works and he can not afford to keep trying different medications that do not work.

## 2016-10-04 ENCOUNTER — Other Ambulatory Visit (HOSPITAL_COMMUNITY): Payer: Self-pay | Admitting: Psychiatry

## 2016-10-04 DIAGNOSIS — G4700 Insomnia, unspecified: Secondary | ICD-10-CM | POA: Insufficient documentation

## 2016-10-04 DIAGNOSIS — F99 Mental disorder, not otherwise specified: Principal | ICD-10-CM

## 2016-10-04 DIAGNOSIS — F5105 Insomnia due to other mental disorder: Secondary | ICD-10-CM

## 2016-10-04 NOTE — Telephone Encounter (Signed)
I talked it over with him.  He is going to try 0.15 mg at night and 0.05 mg in the morning, and see how this goes.  Thank you!

## 2016-10-04 NOTE — Telephone Encounter (Signed)
He can stop Paxil, and try taking clonidine in the evening, and take a dose in the morning as well.  If this is helpful we can switch him to the long acting version of clonidine (Kapvay) to get better coverage.

## 2016-10-04 NOTE — Telephone Encounter (Signed)
I called the patient and he said he would rather not take the Clonidine in the morning because it makes him really sleepy. Please review and advise, thank you

## 2016-10-10 ENCOUNTER — Ambulatory Visit (HOSPITAL_COMMUNITY): Payer: Self-pay | Admitting: Psychiatry

## 2016-11-04 ENCOUNTER — Telehealth (HOSPITAL_COMMUNITY): Payer: Self-pay

## 2016-11-04 NOTE — Telephone Encounter (Signed)
Medication management - Telephone message left for patient Dr. Rene KocherEksir was out of the office this week but to call us back and could follow up with any concerns with covering provider as pt. requested Dr. Rene KocherEksir give him a call.   Patient did not state any concerns on message left but only requested MD call him back.  Will wait on patient to call back to follow up with covering provider and message to Dr. Rene KocherEksir for follow up upon his return.

## 2016-11-11 ENCOUNTER — Telehealth (HOSPITAL_COMMUNITY): Payer: Self-pay

## 2016-11-11 NOTE — Telephone Encounter (Signed)
I called patient to let him know, he was not happy but said he will figure it out on his own. I let patient know that he could call me anytime if he needed a referral.

## 2016-11-11 NOTE — Telephone Encounter (Signed)
I am sorry that he feels this way.  I wish him best of luck in finding a new psychiatrist. Let us know if we can be of assistance in providing a referral.

## 2016-11-11 NOTE — Telephone Encounter (Signed)
Patient is requesting a call back from you, he states that the current medications are not working. 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

## 2016-11-14 ENCOUNTER — Ambulatory Visit (HOSPITAL_COMMUNITY): Payer: Self-pay | Admitting: Psychiatry

## 2019-01-01 IMAGING — US US PELVIS LIMITED
1 series · 5 of 5 positions shown · non-contrast
Comparison: None.

CLINICAL DATA: 40-year-old male with palpable abnormality
discovered in the right groin 3 weeks ago with associated pain.
Initial encounter.

EXAM:
LIMITED ULTRASOUND OF PELVIS
TECHNIQUE: Limited transabdominal ultrasound examination of the pelvis was
performed.

[Series 1: us pelvis limited · 0.10mm/px · 5 of 5 slices shown]
[im 1/5]
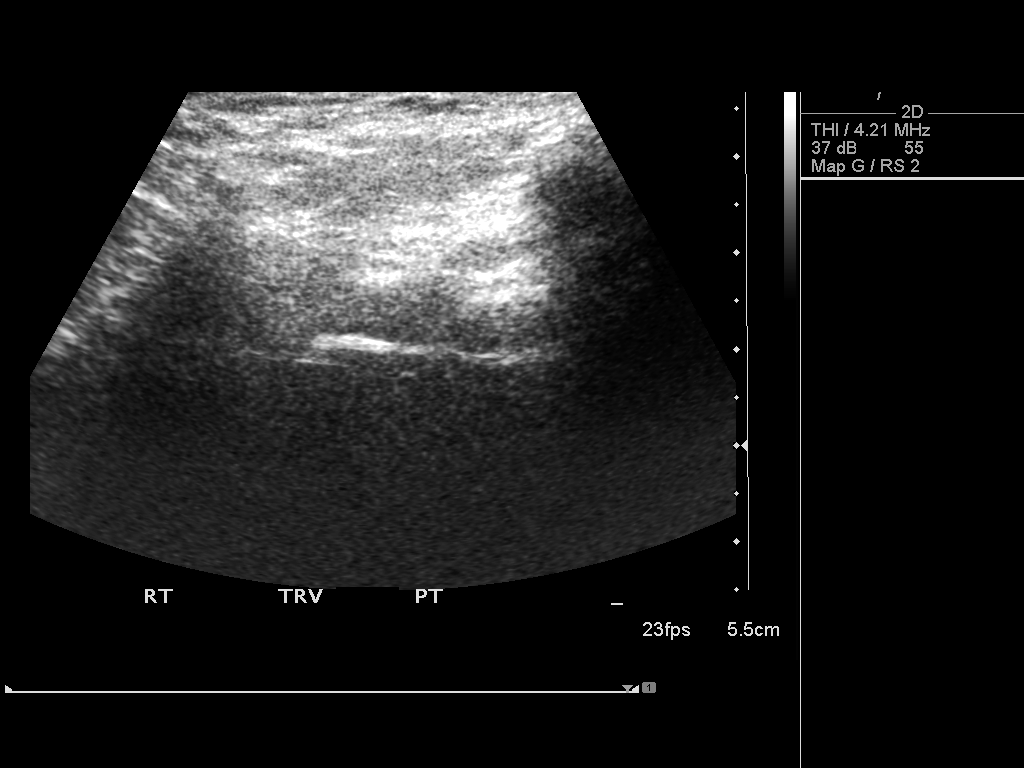
[im 2/5]
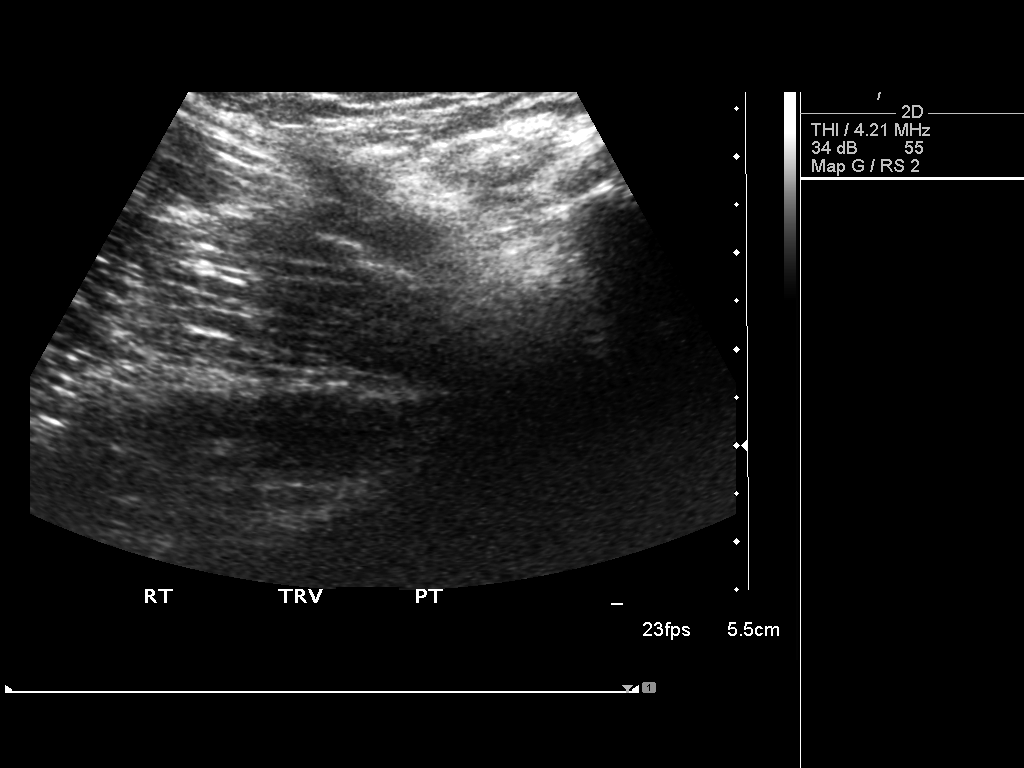
[im 3/5]
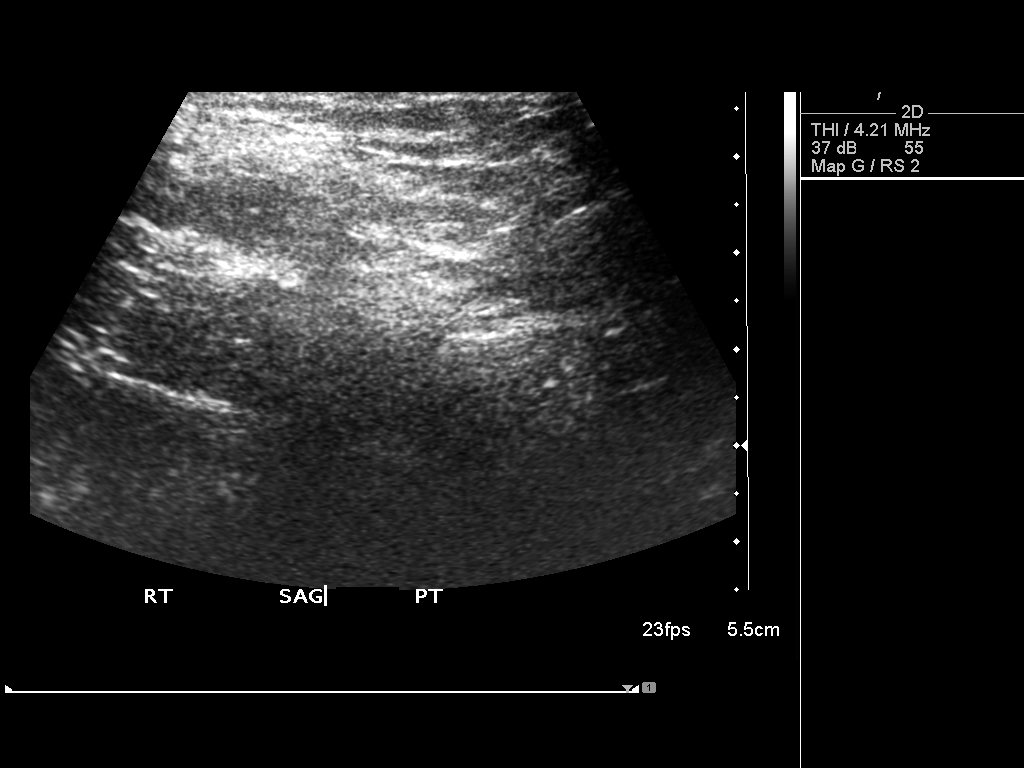
[im 4/5]
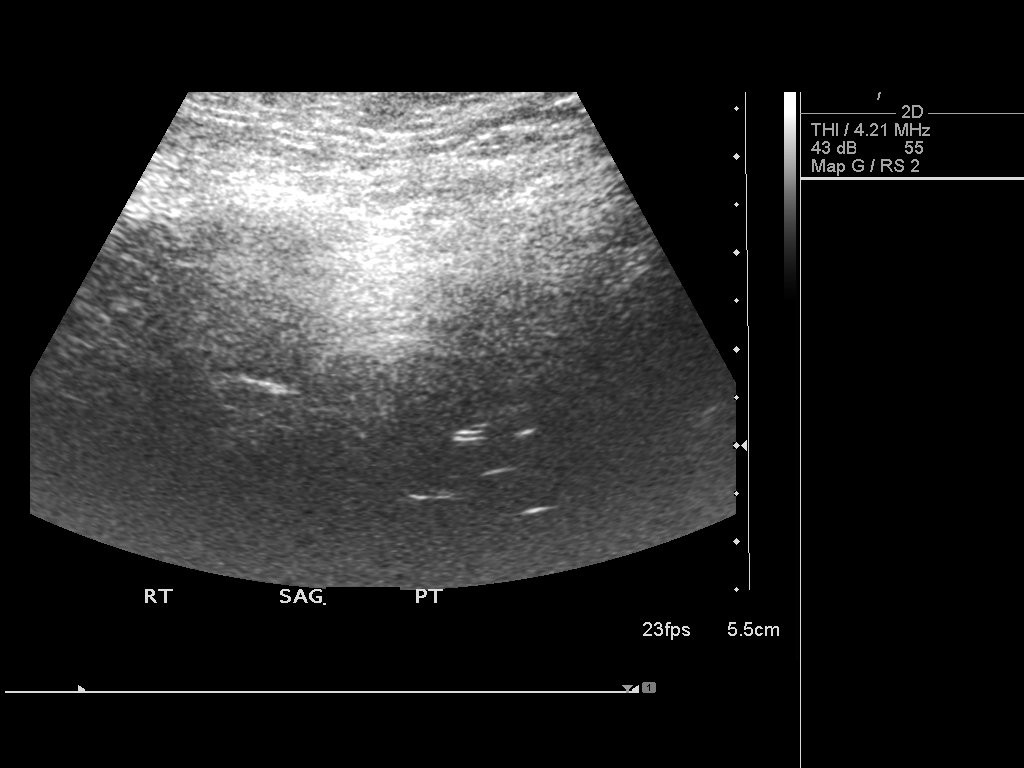
[im 5/5]
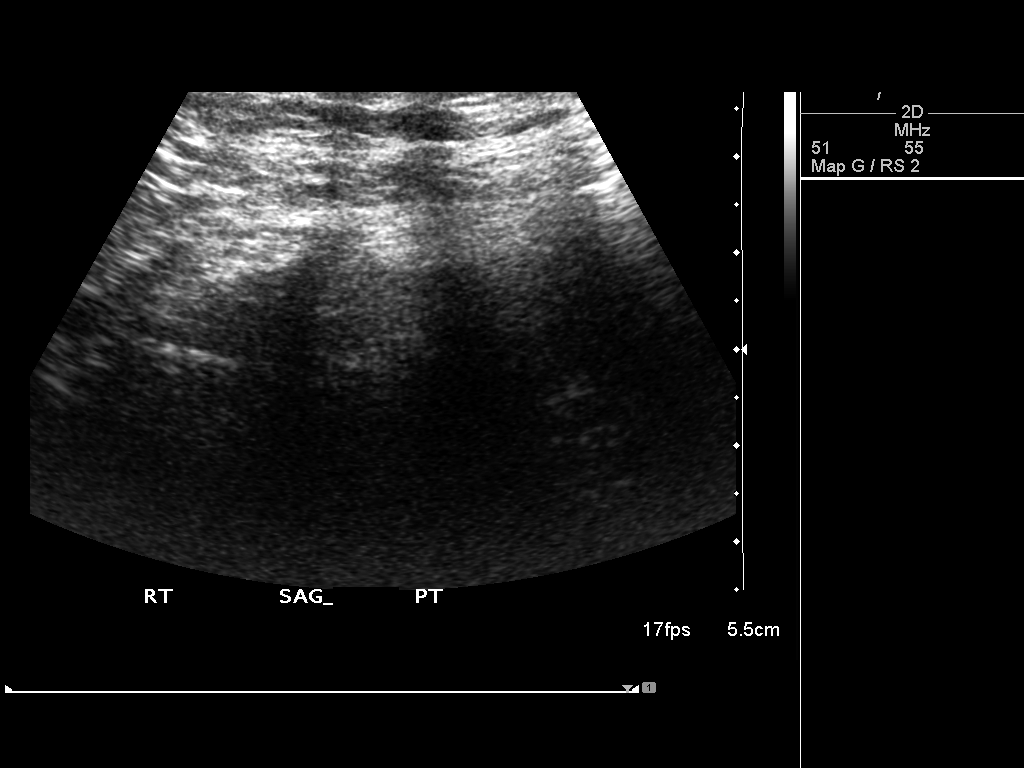

[5 of 5 positions shown; findings below may reference images not displayed]

FINDINGS: The area of clinical concern was imaged. Normal appearing
fibromuscular architecture is demonstrated. No solid or cystic mass
identified. No hernia identified.
IMPRESSION: No ultrasound abnormality at the area of clinical concern. If there
is continued clinical suspicion of hernia Pelvis CT (IV and oral
contrast preferred) would best evaluate further.

## 2024-01-16 ENCOUNTER — Encounter: Payer: Self-pay | Admitting: Advanced Practice Midwife
